# Patient Record
Sex: Female | Born: 1975 | Race: White | Hispanic: No | Marital: Married | Smoking: Former smoker
Health system: Southern US, Community
[De-identification: ages and names within clinical notes are randomized; demographics above are authoritative.]

## PROBLEM LIST (undated history)

## (undated) DIAGNOSIS — G459 Transient cerebral ischemic attack, unspecified: Secondary | ICD-10-CM

## (undated) DIAGNOSIS — J309 Allergic rhinitis, unspecified: Secondary | ICD-10-CM

## (undated) DIAGNOSIS — E781 Pure hyperglyceridemia: Secondary | ICD-10-CM

## (undated) DIAGNOSIS — E7800 Pure hypercholesterolemia, unspecified: Secondary | ICD-10-CM

## (undated) DIAGNOSIS — E119 Type 2 diabetes mellitus without complications: Secondary | ICD-10-CM

## (undated) DIAGNOSIS — J45909 Unspecified asthma, uncomplicated: Secondary | ICD-10-CM

## (undated) HISTORY — DX: Unspecified asthma, uncomplicated: J45.909

## (undated) HISTORY — DX: Transient cerebral ischemic attack, unspecified: G45.9

## (undated) HISTORY — PX: CHOLECYSTECTOMY: SHX55

## (undated) HISTORY — DX: Type 2 diabetes mellitus without complications: E11.9

## (undated) HISTORY — PX: TUBAL LIGATION: SHX77

## (undated) HISTORY — DX: Allergic rhinitis, unspecified: J30.9

## (undated) HISTORY — DX: Pure hyperglyceridemia: E78.1

## (undated) HISTORY — PX: ABDOMINAL HYSTERECTOMY: SHX81

## (undated) HISTORY — DX: Pure hypercholesterolemia, unspecified: E78.00

---

## 2016-01-03 DIAGNOSIS — E119 Type 2 diabetes mellitus without complications: Secondary | ICD-10-CM | POA: Insufficient documentation

## 2016-01-03 HISTORY — DX: Type 2 diabetes mellitus without complications: E11.9

## 2016-04-30 ENCOUNTER — Encounter: Payer: Self-pay | Admitting: Allergy and Immunology

## 2016-04-30 ENCOUNTER — Ambulatory Visit (INDEPENDENT_AMBULATORY_CARE_PROVIDER_SITE_OTHER): Payer: PRIVATE HEALTH INSURANCE | Admitting: Allergy and Immunology

## 2016-04-30 VITALS — BP 122/90 | HR 76 | Temp 98.2°F | Resp 20 | Ht 62.5 in | Wt 179.0 lb

## 2016-04-30 DIAGNOSIS — T7840XA Allergy, unspecified, initial encounter: Secondary | ICD-10-CM | POA: Diagnosis not present

## 2016-04-30 DIAGNOSIS — H1010 Acute atopic conjunctivitis, unspecified eye: Secondary | ICD-10-CM | POA: Diagnosis not present

## 2016-04-30 DIAGNOSIS — J309 Allergic rhinitis, unspecified: Secondary | ICD-10-CM | POA: Diagnosis not present

## 2016-04-30 DIAGNOSIS — J4530 Mild persistent asthma, uncomplicated: Secondary | ICD-10-CM

## 2016-04-30 NOTE — Progress Notes (Signed)
Follow-up Note  Referring Provider: No ref. provider found Primary Provider: Konrad Felix, MD Date of Office Visit: 04/30/2016  Subjective:   Anna Combs (DOB: 08/05/1976) is a 40 y.o. female who [facility] in re-evaluation of the following:  HPI: Anna Combs returns to this clinic in reevaluation of asthma and allergic rhinoconjunctivitis and LPR and tobacco use and a recent allergic reaction. I've not seen her in his clinic since July 2014  Concerning her airway, since she discontinued tobacco use about 2-1/2 years ago she's really done very well and no longer requires a controller agent for her asthma. She only uses a short acting bronchodilator approximately 4 times a year usually for 1 week per episode. She has only required a systemic steroid once in the past 12 months for an episode of "bronchitis and sinusitis". She can exercise without any difficulty.  She's had very little problems with her nose at this point. She does not use any type of nasal steroid. She has no anosmia. She does have a history of migraine presently under very good control.  Her reflux is under very good control while using ranitidine 150 mg twice a day and if she is careful about eating "red sauce" foods. She will take Gaviscon about twice a week if she eats the wrong foods.  Two weeks ago she had an allergic reaction. She ate at [employer] from 8 to 9 PM where she had a small amount of blue cheese dressing on her salad and steak. She woke up at 1:30 in the morning with the sensation of a pulsation in her right head quickly followed by projectile vomiting on 2 occasions and diffuse urticaria and tongue swelling and shortness of breath for which she was evaluated in the emergency room within 12 minutes of allergic reaction onset and treated with epinephrine. She was better within 5 minutes and all of her reaction was gone within 30 minutes. She was apparently also  given systemic steroids.   There was no obvious provoking factor giving rise to this allergic reaction. She has eaten hamburger since that point without any problem. She did not take any over-the-counter medications prior to that reaction.    Medication List      BENADRYL ALLERGY 25 mg capsule Generic drug:  diphenhydrAMINE Take 25 mg by mouth every 6 (six) hours as needed.   CRESTOR 10 MG tablet Generic drug:  rosuvastatin Take 10 mg by mouth daily.   EPINEPHrine 0.3 mg/0.3 mL Soaj injection Commonly known as:  EPI-PEN inject contents OF ONE pen AS NEEDED   ibuprofen 800 MG tablet Commonly known as:  ADVIL,MOTRIN Take 800 mg by mouth every 8 (eight) hours as needed. for pain   metFORMIN 500 MG tablet Commonly known as:  GLUCOPHAGE TAKE TWO TABLETS BY MOUTH AT BREAKFAST AND SUPPER   PROAIR HFA 108 (90 Base) MCG/ACT inhaler Generic drug:  albuterol Inhale 2 puffs into the lungs every 6 (six) hours as needed for wheezing or shortness of breath.   ranitidine 150 MG tablet Commonly known as:  ZANTAC Take 150 mg by mouth daily.   Vitamin D (Ergocalciferol) 50000 units Caps capsule Commonly known as:  DRISDOL Take 50,000 Units by mouth once a week.       Past Medical History:  Diagnosis Date  . Allergic rhinitis   . Asthma   . Diabetes (HCC)   . High cholesterol     Past Surgical History:  Procedure Laterality Date  . ABDOMINAL HYSTERECTOMY    . CHOLECYSTECTOMY    . TUBAL LIGATION      Allergies  Allergen Reactions  . Bactrim [Sulfamethoxazole-Trimethoprim] Shortness Of Breath  . Penicillins Hives  . Sulfa Antibiotics Shortness Of Breath  . Aspirin Hives    Review of systems negative except as noted in HPI / PMHx or noted below:  Review of Systems  Constitutional: Negative.   HENT: Negative.   Eyes: Negative.   Respiratory: Negative.   Cardiovascular: Negative.   Gastrointestinal: Negative.   Genitourinary: Negative.   Musculoskeletal: Negative.     Skin: Negative.   Neurological: Negative.   Endo/Heme/Allergies: Negative.   Psychiatric/Behavioral: Negative.      Objective:   Vitals:   04/30/16 0858  BP: 122/90  Pulse: 76  Resp: 20  Temp: 98.2 F (36.8 C)   Height: 5' 2.5" (158.8 cm)  Weight: 179 lb (81.2 kg)   Physical Exam  Constitutional: She is well-developed, well-nourished, and in no distress.  HENT:  Head: Normocephalic.  Right Ear: Tympanic membrane, external ear and ear canal normal.  Left Ear: Tympanic membrane, external ear and ear canal normal.  Nose: Nose normal. No mucosal edema or rhinorrhea.  Mouth/Throat: Uvula is midline, oropharynx is clear and moist and mucous membranes are normal. No oropharyngeal exudate.  Eyes: Conjunctivae are normal.  Neck: Trachea normal. No tracheal tenderness present. No tracheal deviation present. No thyromegaly present.  Cardiovascular: Normal rate, regular rhythm, S1 normal, S2 normal and normal heart sounds.   No murmur heard. Pulmonary/Chest: Breath sounds normal. No stridor. No respiratory distress. She has no wheezes. She has no rales.  Musculoskeletal: She exhibits no edema.  Lymphadenopathy:       Head (right side): No tonsillar adenopathy present.       Head (left side): No tonsillar adenopathy present.    She has no cervical adenopathy.  Neurological: She is alert. Gait normal.  Skin: No rash noted. She is not diaphoretic. No erythema. Nails show no clubbing.  Psychiatric: Mood and affect normal.    Diagnostics: Allergy skin testing was performed. She did not demonstrate any hypersensitivity against a screening panel of foods or against Penicillium (blue cheese mold)   Spirometry was performed and demonstrated an FEV1 of 2.0 at 71 % of predicted.  The patient had an Asthma Control Test with the following results: ACT Total Score: 23.    Assessment and Plan:   1. Allergic reaction, initial encounter   2. Mild persistent asthma, uncomplicated   3.  Allergic rhinoconjunctivitis     1. Allergen avoidance measures  2. EpiPen, Benadryl, M.D./ER for allergic reaction  3. Continue ProAir HFA or Xopenex nebulization if needed  4. Continue OTC antihistamine if needed  5. Blood - alpha gal panel, CBC with differential, CMP  6. Further evaluation?- Yes, if recurrent  7. Obtain full flu vaccine  8. Contact clinic if recurrent reactions  Raynelle FanningJulie appears to be doing relatively well from a respiratory standpoint but certainly had some type of allergic reaction that developed with unknown etiologic factor. Her history is consistent with alpha gal syndrome and we will evaluate her for this issue as well as looking at the function of her major organs with the blood tests mentioned above. I'll contact her with the results of these blood tests once they're available for review and further evaluation and treatment will be based upon her response to the approach mentioned above and the results of her diagnostic testing.  Allena Katz, MD [place]

## 2016-04-30 NOTE — Patient Instructions (Addendum)
  1. Allergen avoidance measures  2. EpiPen, Benadryl, M.D./ER for allergic reaction  3. Continue ProAir HFA or Xopenex nebulization if needed  4. Continue OTC antihistamine if needed  5. Blood - alpha gal panel, CBC with differential, CMP  6. Further evaluation?- Yes, if recurrent  7. Obtain full flu vaccine  8. Contact clinic if recurrent reactions

## 2016-05-08 ENCOUNTER — Telehealth: Payer: Self-pay

## 2016-05-08 MED ORDER — EPINEPHRINE 0.3 MG/0.3ML IJ SOAJ: INTRAMUSCULAR | 0 refills | Status: DC

## 2016-05-08 NOTE — Telephone Encounter (Signed)
Pt. Advised of  Positive Alpha-Gal lab results as reviewed by Dr. Delorse LekPadgett. Advised to avoid all mammal meat and keep EpiPen at hand.

## 2016-05-08 NOTE — Telephone Encounter (Signed)
Pt advised of all lab results.

## 2016-05-08 NOTE — Telephone Encounter (Signed)
No answer. Called to inform pt that labs showed increased glucose and liver function. Advise to moniter and discuss with PCP.

## 2016-05-08 NOTE — Telephone Encounter (Signed)
error 

## 2016-05-10 ENCOUNTER — Encounter: Payer: Self-pay | Admitting: *Deleted

## 2019-02-04 ENCOUNTER — Telehealth: Payer: Self-pay | Admitting: *Deleted

## 2019-02-04 NOTE — Telephone Encounter (Signed)
REFERRAL SENT TO SCHEDULING AND NOTES ON FILE FROM [facility] [place] [phone].

## 2019-02-06 ENCOUNTER — Telehealth: Payer: Self-pay

## 2019-02-06 NOTE — Telephone Encounter (Signed)
RN called patient and received phone consent for telemedicine visit. Patient does not have a  home BP cuff available, she will weigh herself and all medication out for reconciliation/refills. Patient scheduled for virtual visit and had no further questions.

## 2019-02-09 ENCOUNTER — Other Ambulatory Visit: Payer: Self-pay

## 2019-02-09 ENCOUNTER — Telehealth: Payer: Self-pay | Admitting: Cardiology

## 2019-02-09 ENCOUNTER — Encounter: Payer: Self-pay | Admitting: Cardiology

## 2019-02-09 ENCOUNTER — Telehealth (INDEPENDENT_AMBULATORY_CARE_PROVIDER_SITE_OTHER): Payer: Self-pay | Admitting: Cardiology

## 2019-02-09 VITALS — Ht 62.5 in | Wt 169.0 lb

## 2019-02-09 DIAGNOSIS — E7849 Other hyperlipidemia: Secondary | ICD-10-CM

## 2019-02-09 DIAGNOSIS — R011 Cardiac murmur, unspecified: Secondary | ICD-10-CM

## 2019-02-09 DIAGNOSIS — Z9189 Other specified personal risk factors, not elsewhere classified: Secondary | ICD-10-CM

## 2019-02-09 DIAGNOSIS — E119 Type 2 diabetes mellitus without complications: Secondary | ICD-10-CM

## 2019-02-09 HISTORY — DX: Other hyperlipidemia: E78.49

## 2019-02-09 HISTORY — DX: Other specified personal risk factors, not elsewhere classified: Z91.89

## 2019-02-09 MED ORDER — ICOSAPENT ETHYL 1 G PO CAPS: 2.0000 g | ORAL_CAPSULE | Freq: Two times a day (BID) | ORAL | 4 refills | Status: DC

## 2019-02-09 NOTE — Progress Notes (Signed)
Virtual Visit via Video Note   This visit type was conducted due to national recommendations for restrictions regarding the COVID-19 Pandemic (e.g. social distancing) in an effort to limit this patient's exposure and mitigate transmission in our community.  Due to her co-morbid illnesses, this patient is at least at moderate risk for complications without adequate follow up.  This format is felt to be most appropriate for this patient at this time.  All issues noted in this document were discussed and addressed.  A limited physical exam was performed with this format.  Please refer to the patient's chart for her consent to telehealth for [facility].   Date:  02/09/2019   ID:  Anna AmenJulie Rabine, DOB 1976-06-10, MRN [id number]  Patient Location: Home Provider Location: Home  PCP:  Marlyn CorporalYates, Kate H, [place]  Cardiologist:  No primary care provider on file.  Electrophysiologist:  None   Evaluation Performed:  Consultation - Anna AmenJulie Icenhour was referred by Dr. Tomasa BlaseSchultz for the evaluation of hypertriglyceridemia.  Chief Complaint: Elevated triglycerides  History of Present Illness:    Anna Combs is a 43 y.o. female with past medical history of diabetes mellitus type 2 and familial hyperlipidemia.  She mentions to me that her triglycerides have been greater than 1800 in the past.  She denies any chest pain orthopnea or PND.  She takes care of activities of daily living.  Matter-of-fact she mentions to me that she walks 2 miles a day without any problems.  Her significant issue has been that of hypertriglyceridemia and she tells me that this runs in her family.  At the time of my evaluation, the patient is alert awake oriented and in no distress.  The patient does not have symptoms concerning for COVID-19 infection (fever, chills, cough, or new shortness of breath).    Past Medical History:  Diagnosis Date  . Allergic rhinitis   . Asthma   . Diabetes (HCC)   . High cholesterol    Past Surgical  History:  Procedure Laterality Date  . ABDOMINAL HYSTERECTOMY    . CHOLECYSTECTOMY    . TUBAL LIGATION       Current Meds  Medication Sig  . albuterol (PROAIR HFA) 108 (90 Base) MCG/ACT inhaler Inhale 2 puffs into the lungs every 6 (six) hours as needed for wheezing or shortness of breath.  . busPIRone (BUSPAR) 5 MG tablet Take 5 mg by mouth daily.  Marland Kitchen. co-enzyme Q-10 30 MG capsule Take 30 mg by mouth 3 (three) times daily.  . diphenhydrAMINE (BENADRYL ALLERGY) 25 mg capsule Take 25 mg by mouth every 6 (six) hours as needed.  Marland Kitchen. EPINEPHrine 0.3 mg/0.3 mL IJ SOAJ injection inject contents OF ONE pen AS NEEDED  . ibuprofen (ADVIL,MOTRIN) 800 MG tablet Take 800 mg by mouth every 8 (eight) hours as needed. for pain  . magnesium 30 MG tablet Take 30 mg by mouth 2 (two) times daily.  . metFORMIN (GLUCOPHAGE) 500 MG tablet Take 500 mg by mouth daily with breakfast.   . rosuvastatin (CRESTOR) 10 MG tablet Take 10 mg by mouth 3 (three) times a week.   . TRINTELLIX 20 MG TABS tablet Take 20 mg by mouth daily.  . TRULICITY 1.5 MG/0.5ML SOPN INJECT 1 SYRINGEFUL WEEKLY  . Vitamin D, Ergocalciferol, (DRISDOL) 50000 units CAPS capsule Take 50,000 Units by mouth once a week.     Allergies:   Bactrim [sulfamethoxazole-trimethoprim]; Penicillins; Sulfa antibiotics; and Aspirin   Social History   Tobacco Use  . Smoking  status: Former Smoker    Last attempt to quit: 08/08/2013    Years since quitting: 5.5  . Smokeless tobacco: Never Used  Substance Use Topics  . Alcohol use: Not on file  . Drug use: Not on file     Family Hx: The patient's family history includes Allergic rhinitis in her paternal uncle; Diabetes in her maternal grandmother; Hypertension in her father.  ROS:   Please see the history of present illness.    As mentioned above All other systems reviewed and are negative.   Prior CV studies:   The following studies were reviewed today:  I reviewed records and blood work findings  from primary care physician's office at extensive length  Labs/Other Tests and Data Reviewed:    EKG:  No ECG reviewed.  Recent Labs: No results found for requested labs within last 8760 hours.   Recent Lipid Panel No results found for: CHOL, TRIG, HDL, CHOLHDL, LDLCALC, LDLDIRECT  Wt Readings from Last 3 Encounters:  02/09/19 169 lb (76.7 kg)  04/30/16 179 lb (81.2 kg)     Objective:    Vital Signs:  Ht 5' 2.5" (1.588 m)   Wt 169 lb (76.7 kg)   BMI 30.42 kg/m    VITAL SIGNS:  reviewed  ASSESSMENT & PLAN:    1. Familial hyperlipidemia: I discussed my findings with the patient at extensive length and diet was emphasized.  She is doing good with her exercise plan.  Risks of being overweight emphasized and she is going to do her best to lose weight.  She is taking atorvastatin.  I would like to get a direct LDL on her to understand what her LDL is like.  Also she needs to take Vascepa and we will try to get in touch with her insurance company about letting her have this medication.  She tells me that her plan does not cover it but we will make an effort to see if we can get it released for her. 2. Diabetes mellitus: This is managed by her primary care physician.  Diet was discussed. 3. Echocardiogram will be done to assess history of murmur.  Also when she comes for an echocardiogram I would like her to have an baseline EKG. 4. To risk stratify her from a cardio vascular disease standpoint for a coronary artery disease standpoint I would like to get a calcium scoring and she is willing to get it done at [facility] 5. Patient will be seen in follow-up appointment in 6 months or earlier if the patient has any concerns   COVID-19 Education: The signs and symptoms of COVID-19 were discussed with the patient and how to seek care for testing (follow up with PCP or arrange E-visit).  The importance of social distancing was discussed today.  Time:   Today, I have spent 32 minutes  with the patient with telehealth technology discussing the above problems.  Total time for this evaluation was 45 minutes and it included review of records.  Medication Adjustments/Labs and Tests Ordered: Current medicines are reviewed at length with the patient today.  Concerns regarding medicines are outlined above.   Tests Ordered: No orders of the defined types were placed in this encounter.   Medication Changes: No orders of the defined types were placed in this encounter.   Disposition:  Follow up in 2 month(s)  Signed, Jenean Lindau, MD  02/09/2019 3:00 PM    [place]

## 2019-02-09 NOTE — Patient Instructions (Addendum)
Medication Instruct Your physician has recommended you make the following change in your medication:  START taking vascepa 2 mg( 2 tablets) twice daily  If you need a refill on your cardiac medications before your next appointment, please call your pharmacy.   Lab work: Your physician recommends that you return FASTING for lab work in for Direct LDL.  If you have labs (blood work) drawn today and your tests are completely normal, you will receive your results only by: Marland Kitchen. MyChart Message (if you have MyChart) OR . A paper copy in the mail If you have any lab test that is abnormal or we need to change your treatment, we will call you to review the results.  Testing/Procedures: [facility] have been [facility] to [facility] to have a CT cardiac score performed. You will be contacted to schedule this appt.  Your physician has requested that you have an echocardiogram.YOU HAVE BEEN SCHEDULED TO HAVE THIS PROCEDURE ON March 12, 2019 at 10:15 am. Echocardiography is a painless test that uses sound waves to create images of your heart. It provides your doctor with information about the size and shape of your heart and how well your heart's chambers and valves are working. This procedure takes approximately one hour. There are no restrictions for this procedure.    Follow-Up: At [facility], you and your health needs are our priority.  As part of our continuing mission to provide you with exceptional heart care, we have created designated Provider Care Teams.  These Care Teams include your primary Cardiologist (physician) and Advanced Practice Providers (APPs -  Physician Assistants and Nurse Practitioners) who all work together to provide you with the care you need, when you need it. You will need a follow up appointment in 6 months.   Any Other Special Instructions Will Be Listed Below  Icosapent ethyl capsules What is this medicine? ICOSAPENT ETHYL (eye KOE sa pent eth il) contains essential fats.  It is used to treat high triglyceride levels. This medicine may be used for other purposes; ask your health care provider or pharmacist if you have questions. COMMON BRAND NAME(S): VASCEPA What should I tell my health care provider before I take this medicine? They need to know if you have any of these conditions: -bleeding disorders -liver disease -an unusual or allergic reaction to icosapent ethyl, fish, shellfish, other medicines, foods, dyes, or preservatives -pregnant or trying to get pregnant -breast-feeding How should I use this medicine? Take this medicine by mouth with a glass of water. Follow the directions on the prescription label. Take this medicine with food. Do not cut, crush or chew this medicine. Take your medicine at regular intervals. Do not take it more often than directed. Do not stop taking except on your doctor's advice. Talk to your pediatrician regarding the use of this medicine in children. Special care may be needed. Overdosage: If you think you have taken too much of this medicine contact a poison control center or emergency room at once. NOTE: This medicine is only for you. Do not share this medicine with others. What if I miss a dose? If you miss a dose, take it as soon as you can. If it is almost time for your next dose, take only that dose. Do not take double or extra doses. What may interact with this medicine? This medicine may interact with the following medications: -aspirin and aspirin-like medicines -beta-blockers like metoprolol and propranolol -certain medicines that treat or prevent blood clots like warfarin, enoxaparin,  dalteparin, apixaban, dabigatran, and rivaroxaban -diuretics -female hormones, like estrogens and birth control pills This list may not describe all possible interactions. Give your health care provider a list of all the medicines, herbs, non-prescription drugs, or dietary supplements you use. Also tell them if you smoke, drink  alcohol, or use illegal drugs. Some items may interact with your medicine. What should I watch for while using this medicine? You may need blood work done while you are taking this medicine. Follow a good diet and exercise plan. Taking this medicine does not replace a healthy lifestyle. Some foods that have omega-3 fatty acids naturally are fatty fish like albacore tuna, halibut, herring, mackerel, lake trout, salmon, and sardines. If you are scheduled for any medical or dental procedure, tell your healthcare provider that you are taking this medicine. You may need to stop taking this medicine before the procedure. What side effects may I notice from receiving this medicine? Side effects that you should report to your doctor or health care professional as soon as possible: -allergic reactions like skin rash, itching or hives, swelling of the face, lips, or tongue -breathing problems -unusual bleeding or bruising Side effects that usually do not require medical attention (report to your doctor or health care professional if they continue or are bothersome): -joint pain -sore throat This list may not describe all possible side effects. Call your doctor for medical advice about side effects. You may report side effects to FDA at 1-800-FDA-1088. Where should I keep my medicine? Keep out of the reach of children. Store at room temperature between 15 and 30 degrees C (59 and 86 degrees F). Throw away any unused medicine after the expiration date. NOTE: This sheet is a summary. It may not cover all possible information. If you have questions about this medicine, talk to your doctor, pharmacist, or health care provider.  2019 Elsevier/Gold Standard (2015-09-22 13:40:36)   Coronary Calcium Scan A coronary calcium scan is an imaging test used to look for deposits of calcium and other fatty materials (plaques) in the inner lining of the blood vessels of the heart (coronary arteries). These deposits of  calcium and plaques can partly clog and narrow the coronary arteries without producing any symptoms or warning signs. This puts a person at risk for a heart attack. This test can detect these deposits before symptoms develop. Tell a health care provider about:  Any allergies you have.  All medicines you are taking, including vitamins, herbs, eye drops, creams, and over-the-counter medicines.  Any problems you or family members have had with anesthetic medicines.  Any blood disorders you have.  Any surgeries you have had.  Any medical conditions you have.  Whether you are pregnant or may be pregnant. What are the risks? Generally, this is a safe procedure. However, problems may occur, including:  Harm to a pregnant woman and her unborn baby. This test involves the use of radiation. Radiation exposure can be dangerous to a pregnant woman and her unborn baby. If you are pregnant, you generally should not have this procedure done.  Slight increase in the risk of cancer. This is because of the radiation involved in the test. What happens before the procedure? No preparation is needed for this procedure. What happens during the procedure?   You will undress and remove any jewelry around your neck or chest.  You will put on a hospital gown.  Sticky electrodes will be placed on your chest. The electrodes will be connected to an electrocardiogram (  ECG) machine to record a tracing of the electrical activity of your heart.  A CT scanner will take pictures of your heart. During this time, you will be asked to lie still and hold your breath for 2-3 seconds while a picture of your heart is being taken. The procedure may vary among health care providers and hospitals. What happens after the procedure?  You can get dressed.  You can return to your normal activities.  It is up to you to get the results of your test. Ask your health care provider, or the department that is doing the test, when  your results will be ready. Summary  A coronary calcium scan is an imaging test used to look for deposits of calcium and other fatty materials (plaques) in the inner lining of the blood vessels of the heart (coronary arteries).  Generally, this is a safe procedure. Tell your health care provider if you are pregnant or may be pregnant.  No preparation is needed for this procedure.  A CT scanner will take pictures of your heart.  You can return to your normal activities after the scan is done. This information is not intended to replace advice given to you by your health care provider. Make sure you discuss any questions you have with your health care provider. Document Released: 02/16/2008 Document Revised: 07/09/2016 Document Reviewed: 07/09/2016 Elsevier Interactive Patient Education  2019 [employer]    Echocardiogram An echocardiogram is a procedure that uses painless sound waves (ultrasound) to produce an image of the heart. Images from an echocardiogram can provide important information about:  Signs of coronary artery disease (CAD).  Aneurysm detection. An aneurysm is a weak or damaged part of an artery wall that bulges out from the normal force of blood pumping through the body.  Heart size and shape. Changes in the size or shape of the heart can be associated with certain conditions, including heart failure, aneurysm, and CAD.  Heart muscle function.  Heart valve function.  Signs of a past heart attack.  Fluid buildup around the heart.  Thickening of the heart muscle.  A tumor or infectious growth around the heart valves. Tell a health care provider about:  Any allergies you have.  All medicines you are taking, including vitamins, herbs, eye drops, creams, and over-the-counter medicines.  Any blood disorders you have.  Any surgeries you have had.  Any medical conditions you have.  Whether you are pregnant or may be pregnant. What are the risks?  Generally, this is a safe procedure. However, problems may occur, including:  Allergic reaction to dye (contrast) that may be used during the procedure. What happens before the procedure? No specific preparation is needed. You may eat and drink normally. What happens during the procedure?   An IV tube may be inserted into one of your veins.  You may receive contrast through this tube. A contrast is an injection that improves the quality of the pictures from your heart.  A gel will be applied to your chest.  A wand-like tool (transducer) will be moved over your chest. The gel will help to transmit the sound waves from the transducer.  The sound waves will harmlessly bounce off of your heart to allow the heart images to be captured in real-time motion. The images will be recorded on a computer. The procedure may vary among health care providers and hospitals. What happens after the procedure?  You may return to your normal, everyday life, including diet, activities, and  medicines, unless your health care provider tells you not to do that. Summary  An echocardiogram is a procedure that uses painless sound waves (ultrasound) to produce an image of the heart.  Images from an echocardiogram can provide important information about the size and shape of your heart, heart muscle function, heart valve function, and fluid buildup around your heart.  You do not need to do anything to prepare before this procedure. You may eat and drink normally.  After the echocardiogram is completed, you may return to your normal, everyday life, unless your health care provider tells you not to do that. This information is not intended to replace advice given to you by your health care provider. Make sure you discuss any questions you have with your health care provider. Document Released: 08/17/2000 Document Revised: 09/22/2016 Document Reviewed: 09/22/2016 Elsevier Interactive Patient Education  2019  Elsevier Inc.+

## 2019-02-09 NOTE — Addendum Note (Signed)
Addended by: Beckey Rutter on: 02/09/2019 04:55 PM   Modules accepted: Orders

## 2019-02-09 NOTE — Telephone Encounter (Signed)
Virtual Visit Pre-Appointment Phone Call  "(Name), I am calling you today to discuss your upcoming appointment. We are currently trying to limit exposure to the virus that causes COVID-19 by seeing patients at home rather than in the office."  1. "What is the BEST phone number to call the day of the visit?" - include this in appointment notes  2. Do you have or have access to (through a family member/friend) a smartphone with video capability that we can use for your visit?" a. If yes - list this number in appt notes as cell (if different from BEST phone #) and list the appointment type as a VIDEO visit in appointment notes b. If no - list the appointment type as a PHONE visit in appointment notes  3. Confirm consent - "In the setting of the current Covid19 crisis, you are scheduled for a (phone or video) visit with your provider on (date) at (time).  Just as we do with many in-office visits, in order for you to participate in this visit, we must obtain consent.  If you'd like, I can send this to your mychart (if signed up) or email for you to review.  Otherwise, I can obtain your verbal consent now.  All virtual visits are billed to your insurance company just like a normal visit would be.  By agreeing to a virtual visit, we'd like you to understand that the technology does not allow for your provider to perform an examination, and thus may limit your provider's ability to fully assess your condition. If your provider identifies any concerns that need to be evaluated in person, we will make arrangements to do so.  Finally, though the technology is pretty good, we cannot assure that it will always work on either your or our end, and in the setting of a video visit, we may have to convert it to a phone-only visit.  In either situation, we cannot ensure that we have a secure connection.  Are you willing to proceed?" STAFF: Did the patient verbally acknowledge consent to telehealth visit? Document  YES/NO here:YES per Cameron Ali, RN   4. Advise patient to be prepared - "Two hours prior to your appointment, go ahead and check your blood pressure, pulse, oxygen saturation, and your weight (if you have the equipment to check those) and write them all down. When your visit starts, your provider will ask you for this information. If you have an Apple Watch or Kardia device, please plan to have heart rate information ready on the day of your appointment. Please have a pen and paper handy nearby the day of the visit as well."  5. Give patient instructions for MyChart download to smartphone OR Doximity/Doxy.me as below if video visit (depending on what platform provider is using)  6. Inform patient they will receive a phone call 15 minutes prior to their appointment time (may be from unknown caller ID) so they should be prepared to answer    TELEPHONE CALL NOTE  Anna Combs has been deemed a candidate for a follow-up tele-health visit to limit community exposure during the Covid-19 pandemic. I spoke with the patient via phone to ensure availability of phone/video source, confirm preferred email & phone number, and discuss instructions and expectations.  I reminded Anna Combs to be prepared with any vital sign and/or heart rhythm information that could potentially be obtained via home monitoring, at the time of her visit. I reminded Anna Combs to expect a phone call prior  to her visit.  Minette BrineLisa B Welch 02/09/2019 2:06 PM   INSTRUCTIONS FOR DOWNLOADING THE MYCHART APP TO SMARTPHONE  - The patient must first make sure to have activated MyChart and know their login information - If Apple, go to [employer] Store and type in MyChart in the search bar and download the app. If Android, ask patient to go to [employer] Play Store and type in [place] in the search bar and download the app. The app is free but as with any other app downloads, their phone may require them to verify saved payment information or  Apple/Android password.  - The patient will need to then log into the app with their MyChart username and password, and select [facility] as their healthcare provider to link the account. When it is time for your visit, go to the MyChart app, find appointments, and click Begin Video Visit. Be sure to Select Allow for your device to access the Microphone and Camera for your visit. You will then be connected, and your provider will be with you shortly.  **If they have any issues connecting, or need assistance please contact MyChart service desk [phone])**  **If using a computer, in order to ensure the best quality for their visit they will need to use either of the following Internet Browsers: [employer] Edge, or Google Chrome**  IF USING DOXIMITY or DOXY.ME - The patient will receive a link just prior to their visit by text.     FULL LENGTH CONSENT FOR TELE-HEALTH VISIT   I hereby voluntarily request, consent and authorize [facility] and its employed or contracted physicians, physician assistants, nurse practitioners or other licensed health care professionals (the Practitioner), to provide me with telemedicine health care services (the Services") as deemed necessary by the treating Practitioner. I acknowledge and consent to receive the Services by the Practitioner via telemedicine. I understand that the telemedicine visit will involve communicating with the Practitioner through live audiovisual communication technology and the disclosure of certain medical information by electronic transmission. I acknowledge that I have been given the opportunity to request an in-person assessment or other available alternative prior to the telemedicine visit and am voluntarily participating in the telemedicine visit.  I understand that I have the right to withhold or withdraw my consent to the use of telemedicine in the course of my care at any time, without affecting my right to future care  or treatment, and that the Practitioner or I may terminate the telemedicine visit at any time. I understand that I have the right to inspect all information obtained and/or recorded in the course of the telemedicine visit and may receive copies of available information for a reasonable fee.  I understand that some of the potential risks of receiving the Services via telemedicine include:   Delay or interruption in medical evaluation due to technological equipment failure or disruption;  Information transmitted may not be sufficient (e.g. poor resolution of images) to allow for appropriate medical decision making by the Practitioner; and/or   In rare instances, security protocols could fail, causing a breach of personal health information.  Furthermore, I acknowledge that it is my responsibility to provide information about my medical history, conditions and care that is complete and accurate to the best of my ability. I acknowledge that Practitioner's advice, recommendations, and/or decision may be based on factors not within their control, such as incomplete or inaccurate data provided by me or distortions of diagnostic images or specimens that may result from electronic transmissions.  I understand that the practice of medicine is not an exact science and that Practitioner makes no warranties or guarantees regarding treatment outcomes. I acknowledge that I will receive a copy of this consent concurrently upon execution via email to the email address I last provided but may also request a printed copy by calling the office of [facility].    I understand that my insurance will be billed for this visit.   I have read or had this consent read to me.  I understand the contents of this consent, which adequately explains the benefits and risks of the Services being provided via telemedicine.   I have been provided ample opportunity to ask questions regarding this consent and the Services and have had  my questions answered to my satisfaction.  I give my informed consent for the services to be provided through the use of telemedicine in my medical care  By participating in this telemedicine visit I agree to the above.

## 2019-02-11 ENCOUNTER — Other Ambulatory Visit: Payer: Self-pay

## 2019-02-11 ENCOUNTER — Telehealth: Payer: Self-pay

## 2019-02-12 ENCOUNTER — Telehealth: Payer: Self-pay | Admitting: Cardiology

## 2019-02-12 NOTE — Telephone Encounter (Signed)
Patient needs lab order to go to [facility] due to her insurance plese, it will be free to her., she will pick up samples tomorrow afternoon between 4-4:30. We cna fax lab orders to hosp.

## 2019-02-13 NOTE — Telephone Encounter (Signed)
Patient requested lab request be sent to [facility], RN faxed request.

## 2019-02-17 NOTE — Telephone Encounter (Signed)
Additional labs ordered.

## 2019-03-12 ENCOUNTER — Other Ambulatory Visit: Payer: Self-pay

## 2019-03-18 ENCOUNTER — Telehealth: Payer: Self-pay | Admitting: Cardiology

## 2019-03-18 NOTE — Telephone Encounter (Signed)
Patient has been calling for results of Cardiac Scoring at [facility] for 3 weeks and still not gotten results.. Please call paitent asap!

## 2019-03-20 ENCOUNTER — Telehealth: Payer: Self-pay | Admitting: Cardiology

## 2019-03-20 NOTE — Telephone Encounter (Signed)
Pt cancelled all upcoming appts, states she called a few days ago for some test results and no one has returned her call-she is going to go somewhere in [place]

## 2019-03-20 NOTE — Telephone Encounter (Signed)
Left vm for patient to return call concerning calcium score results from [place].

## 2019-08-13 ENCOUNTER — Telehealth: Payer: Self-pay | Admitting: Cardiology

## 2020-05-06 ENCOUNTER — Other Ambulatory Visit: Payer: Self-pay | Admitting: Cardiology

## 2020-05-06 DIAGNOSIS — Z9189 Other specified personal risk factors, not elsewhere classified: Secondary | ICD-10-CM

## 2020-05-06 DIAGNOSIS — E7849 Other hyperlipidemia: Secondary | ICD-10-CM

## 2020-06-26 DIAGNOSIS — G459 Transient cerebral ischemic attack, unspecified: Secondary | ICD-10-CM

## 2020-07-25 ENCOUNTER — Other Ambulatory Visit: Payer: Self-pay

## 2020-07-25 DIAGNOSIS — J309 Allergic rhinitis, unspecified: Secondary | ICD-10-CM | POA: Insufficient documentation

## 2020-07-25 DIAGNOSIS — J45909 Unspecified asthma, uncomplicated: Secondary | ICD-10-CM | POA: Insufficient documentation

## 2020-07-25 DIAGNOSIS — E119 Type 2 diabetes mellitus without complications: Secondary | ICD-10-CM | POA: Insufficient documentation

## 2020-07-25 DIAGNOSIS — E7800 Pure hypercholesterolemia, unspecified: Secondary | ICD-10-CM | POA: Insufficient documentation

## 2020-07-26 ENCOUNTER — Encounter: Payer: Self-pay | Admitting: Cardiology

## 2020-07-26 ENCOUNTER — Other Ambulatory Visit: Payer: Self-pay

## 2020-07-26 ENCOUNTER — Ambulatory Visit (INDEPENDENT_AMBULATORY_CARE_PROVIDER_SITE_OTHER): Payer: 59 | Admitting: Cardiology

## 2020-07-26 VITALS — BP 130/78 | HR 87 | Ht 62.0 in | Wt 158.2 lb

## 2020-07-26 DIAGNOSIS — E119 Type 2 diabetes mellitus without complications: Secondary | ICD-10-CM

## 2020-07-26 DIAGNOSIS — E7849 Other hyperlipidemia: Secondary | ICD-10-CM

## 2020-07-26 DIAGNOSIS — Z9189 Other specified personal risk factors, not elsewhere classified: Secondary | ICD-10-CM | POA: Diagnosis not present

## 2020-07-26 DIAGNOSIS — E088 Diabetes mellitus due to underlying condition with unspecified complications: Secondary | ICD-10-CM

## 2020-07-26 HISTORY — DX: Diabetes mellitus due to underlying condition with unspecified complications: E08.8

## 2020-07-26 MED ORDER — ICOSAPENT ETHYL 1 G PO CAPS: 2.0000 g | ORAL_CAPSULE | Freq: Two times a day (BID) | ORAL | 12 refills | Status: DC

## 2020-07-26 NOTE — Progress Notes (Signed)
Cardiology Office Note:    Date:  07/26/2020   ID:  Anna Combs, DOB 1976/08/16, MRN [id number]  PCP:  Anna Corporal, PA  Cardiologist:  Anna Brothers, MD   Referring MD: Anna Corporal, PA    ASSESSMENT:    1. Familial hyperlipidemia   2. Type 2 diabetes mellitus without complication, without long-term current use of insulin (HCC)   3. At high risk for coronary artery disease   4. Diabetes mellitus due to underlying condition with unspecified complications (HCC)    PLAN:    In order of problems listed above:  1. Primary prevention stressed with the patient.  Importance of compliance with diet medication stressed and she vocalized understanding.  I told her to walk at least half an hour a day on a daily basis and she promises to do so. 2. Diabetes mellitus: Hemoglobin A1c is elevated.  This is followed by her primary care physician and endocrinologist.  I told her about optimization of treatment and diet and compliance and she promises to do so. 3. Hypertriglyceridemia/familial hyper lipidemia: I discussed my findings with her at length.  Her triglycerides are in excess of 1500.  I initiated Vascepa 2 g twice daily.  She will be back in 6 weeks for liver lipid check.  I will give her an appointment with the lipid clinic for the needful.  Patient had multiple questions which were answered to her satisfaction. 4. Patient will be seen in follow-up appointment in 6 months or earlier if the patient has any concerns    Medication Adjustments/Labs and Tests Ordered: Current medicines are reviewed at length with the patient today.  Concerns regarding medicines are outlined above.  No orders of the defined types were placed in this encounter.  No orders of the defined types were placed in this encounter.    No chief complaint on file.    History of Present Illness:    Anna Combs is a 44 y.o. female.  Patient has past medical history of diabetes mellitus, familial  hyperlipidemia with marked triglyceridemia in excess of 1500.  Patient mentions to me that she was in the hospital recently for TIA-like symptoms.  I reviewed records extensively.  Patient denies any problems at this time.  She takes care of activities of daily living.  No chest pain orthopnea or PND.  At the time of my evaluation, the patient is alert awake oriented and in no distress.  Past Medical History:  Diagnosis Date  . Allergic rhinitis   . Asthma   . At high risk for coronary artery disease 02/09/2019  . Diabetes (HCC)   . Familial hyperlipidemia 02/09/2019  . High cholesterol   . Type 2 diabetes mellitus without complication (HCC) 01/03/2016    Past Surgical History:  Procedure Laterality Date  . ABDOMINAL HYSTERECTOMY    . CHOLECYSTECTOMY    . TUBAL LIGATION      Current Medications: Current Meds  Medication Sig  . albuterol (PROAIR HFA) 108 (90 Base) MCG/ACT inhaler Inhale 2 puffs into the lungs every 6 (six) hours as needed for wheezing or shortness of breath.  . clopidogrel (PLAVIX) 75 MG tablet Take 75 mg by mouth daily.  . diphenhydrAMINE (BENADRYL ALLERGY) 25 mg capsule Take 25 mg by mouth every 6 (six) hours as needed.  . DULoxetine (CYMBALTA) 60 MG capsule Take 60 mg by mouth daily.  Marland Kitchen EPINEPHrine 0.3 mg/0.3 mL IJ SOAJ injection inject contents OF ONE pen AS NEEDED  . famotidine (  PEPCID) 40 MG tablet Take 40 mg by mouth 2 (two) times daily.  . fenofibrate (TRICOR) 145 MG tablet Take 145 mg by mouth daily.  Marland Kitchen gabapentin (NEURONTIN) 100 MG capsule Take 100 mg by mouth 3 (three) times daily.  Marland Kitchen ibuprofen (ADVIL,MOTRIN) 800 MG tablet Take 800 mg by mouth every 8 (eight) hours as needed. for pain  . magnesium 30 MG tablet Take 30 mg by mouth 2 (two) times daily.  . metFORMIN (GLUCOPHAGE) 500 MG tablet Take 500 mg by mouth daily with breakfast.   . methocarbamol (ROBAXIN) 500 MG tablet Take 500-1,000 mg by mouth 3 (three) times daily as needed.  . montelukast (SINGULAIR) 10  MG tablet Take 10 mg by mouth daily.  . ondansetron (ZOFRAN-ODT) 8 MG disintegrating tablet Take 8 mg by mouth every 8 (eight) hours as needed.  . rosuvastatin (CRESTOR) 40 MG tablet Take 40 mg by mouth daily.  . TRULICITY 3 MG/0.5ML SOPN Inject 3 mg into the skin once a week.  . vitamin C (ASCORBIC ACID) 250 MG tablet Take 250 mg by mouth 2 (two) times daily.  . Vitamin D, Ergocalciferol, (DRISDOL) 50000 units CAPS capsule Take 50,000 Units by mouth 2 (two) times a week.   . zinc gluconate 50 MG tablet Take 50 mg by mouth daily.     Allergies:   Bactrim [sulfamethoxazole-trimethoprim], Penicillins, Sulfa antibiotics, and Aspirin   Social History   Socioeconomic History  . Marital status: Married    Spouse name: Not on file  . Number of children: Not on file  . Years of education: Not on file  . Highest education level: Not on file  Occupational History  . Not on file  Tobacco Use  . Smoking status: Former Smoker    Quit date: 08/08/2013    Years since quitting: 6.9  . Smokeless tobacco: Never Used  Substance and Sexual Activity  . Alcohol use: Not on file  . Drug use: Not on file  . Sexual activity: Not on file  Other Topics Concern  . Not on file  Social History Narrative  . Not on file   Social Determinants of Health   Financial Resource Strain:   . Difficulty of Paying Living Expenses: Not on file  Food Insecurity:   . Worried About Programme researcher, broadcasting/film/video in the Last Year: Not on file  . Ran Out of Food in the Last Year: Not on file  Transportation Needs:   . Lack of Transportation (Medical): Not on file  . Lack of Transportation (Non-Medical): Not on file  Physical Activity:   . Days of Exercise per Week: Not on file  . Minutes of Exercise per Session: Not on file  Stress:   . Feeling of Stress : Not on file  Social Connections:   . Frequency of Communication with Friends and Family: Not on file  . Frequency of Social Gatherings with Friends and Family: Not on  file  . Attends Religious Services: Not on file  . Active Member of Clubs or Organizations: Not on file  . Attends Banker Meetings: Not on file  . Marital Status: Not on file     Family History: The patient's family history includes Allergic rhinitis in her paternal uncle; Diabetes in her maternal grandmother; Hypertension in her father.  ROS:   Please see the history of present illness.    All other systems reviewed and are negative.  EKGs/Labs/Other Studies Reviewed:    The following studies were reviewed  today: EKG reveals sinus rhythm and nonspecific ST-T changes   Recent Labs: No results found for requested labs within last 8760 hours.  Recent Lipid Panel No results found for: CHOL, TRIG, HDL, CHOLHDL, VLDL, LDLCALC, LDLDIRECT  Physical Exam:    VS:  BP 130/78   Pulse 87   Ht 5\' 2"  (1.575 m)   Wt 158 lb 3.2 oz (71.8 kg)   SpO2 97%   BMI 28.94 kg/m     Wt Readings from Last 3 Encounters:  07/26/20 158 lb 3.2 oz (71.8 kg)  02/09/19 169 lb (76.7 kg)  04/30/16 179 lb (81.2 kg)     GEN: Patient is in no acute distress HEENT: Normal NECK: No JVD; No carotid bruits LYMPHATICS: No lymphadenopathy CARDIAC: Hear sounds regular, 2/6 systolic murmur at the apex. RESPIRATORY:  Clear to auscultation without rales, wheezing or rhonchi  ABDOMEN: Soft, non-tender, non-distended MUSCULOSKELETAL:  No edema; No deformity  SKIN: Warm and dry NEUROLOGIC:  Alert and oriented x 3 PSYCHIATRIC:  Normal affect   Signed, 05/02/16, MD  07/26/2020 8:37 AM    [facility]

## 2020-07-26 NOTE — Patient Instructions (Signed)
Medication Instructions:  Your physician has recommended you make the following change in your medication:   Start Vascepa 2 gms twice daily.  *If you need a refill on your cardiac medications before your next appointment, please call your pharmacy*   Lab Work: None ordered If you have labs (blood work) drawn today and your tests are completely normal, you will receive your results only by: Marland Kitchen MyChart Message (if you have MyChart) OR . A paper copy in the mail If you have any lab test that is abnormal or we need to change your treatment, we will call you to review the results.   Testing/Procedures: None ordered   Follow-Up: At [facility], you and your health needs are our priority.  As part of our continuing mission to provide you with exceptional heart care, we have created designated Provider Care Teams.  These Care Teams include your primary Cardiologist (physician) and Advanced Practice Providers (APPs -  Physician Assistants and Nurse Practitioners) who all work together to provide you with the care you need, when you need it.  We recommend signing up for the patient portal called "MyChart".  Sign up information is provided on this After Visit Summary.  MyChart is used to connect with patients for Virtual Visits (Telemedicine).  Patients are able to view lab/test results, encounter notes, upcoming appointments, etc.  Non-urgent messages can be sent to your provider as well.   To learn more about what you can do with MyChart, go to [email or web address].    Your next appointment:   4 month(s)  The format for your next appointment:   In Person  Provider:   Belva Crome, MD   Other Instructions NA

## 2020-09-22 ENCOUNTER — Encounter: Payer: Self-pay | Admitting: *Deleted

## 2020-09-23 ENCOUNTER — Other Ambulatory Visit: Payer: Self-pay

## 2020-09-23 ENCOUNTER — Ambulatory Visit (INDEPENDENT_AMBULATORY_CARE_PROVIDER_SITE_OTHER): Payer: 59 | Admitting: Diagnostic Neuroimaging

## 2020-09-23 ENCOUNTER — Encounter: Payer: Self-pay | Admitting: Diagnostic Neuroimaging

## 2020-09-23 VITALS — BP 118/82 | HR 88 | Ht 63.5 in | Wt 158.0 lb

## 2020-09-23 DIAGNOSIS — G450 Vertebro-basilar artery syndrome: Secondary | ICD-10-CM

## 2020-09-23 NOTE — Progress Notes (Signed)
[facility]  PATIENT: Anna Combs DOB: 07-Aug-1976  REFERRING CLINICIAN: Marlyn Corporal, PA HISTORY FROM: patient  REASON FOR VISIT: new consult    HISTORICAL  CHIEF COMPLAINT:  Chief Complaint  Patient presents with  . New Patient (Initial Visit)    Rm 7, alone. Here to follow up on hospitalization s/p TIA. Doing well. No new symptoms.    HISTORY OF PRESENT ILLNESS:   45 year old female with uncontrolled diabetes and hyperlipidemia, here for evaluation of TIA.  June 26, 2020 patient had 10-minute episode of complete blindness, spinning sensation, nausea.  As symptoms improved patient had some double vision which then resolved.  Patient went to the hospital for evaluation and had TIA work-up completed.  No further recurrent events.  No prior similar events.   REVIEW OF SYSTEMS: Full 14 system review of systems performed and negative with exception of: As per HPI.  ALLERGIES: Allergies  Allergen Reactions  . Bactrim [Sulfamethoxazole-Trimethoprim] Shortness Of Breath  . Penicillins Hives  . Sulfa Antibiotics Shortness Of Breath  . Aspirin Hives  . Codeine   . Shellfish Allergy     HOME MEDICATIONS: Outpatient Medications Prior to Visit  Medication Sig Dispense Refill  . albuterol (VENTOLIN HFA) 108 (90 Base) MCG/ACT inhaler Inhale 2 puffs into the lungs every 6 (six) hours as needed for wheezing or shortness of breath.    . Cholecalciferol (VITAMIN D3 PO) Take by mouth.    . clopidogrel (PLAVIX) 75 MG tablet Take 75 mg by mouth daily.    . diphenhydrAMINE (BENADRYL) 25 mg capsule Take 25 mg by mouth every 6 (six) hours as needed.    . DULoxetine (CYMBALTA) 60 MG capsule Take 60 mg by mouth daily.    Marland Kitchen EPINEPHrine 0.3 mg/0.3 mL IJ SOAJ injection inject contents OF ONE pen AS NEEDED 1 Device 0  . famotidine (PEPCID) 40 MG tablet Take 40 mg by mouth 2 (two) times daily.    . fenofibrate (TRICOR) 145 MG tablet Take 145 mg by mouth daily.    Marland Kitchen  gabapentin (NEURONTIN) 100 MG capsule Take 100 mg by mouth 3 (three) times daily.    Marland Kitchen ibuprofen (ADVIL,MOTRIN) 800 MG tablet Take 800 mg by mouth every 8 (eight) hours as needed. for pain  0  . magnesium 30 MG tablet Take 30 mg by mouth 2 (two) times daily.    . metFORMIN (GLUCOPHAGE) 500 MG tablet Take 1,000 mg by mouth daily with breakfast.  2  . methocarbamol (ROBAXIN) 500 MG tablet Take 500-1,000 mg by mouth 3 (three) times daily as needed.    . montelukast (SINGULAIR) 10 MG tablet Take 10 mg by mouth daily.    . ondansetron (ZOFRAN) 4 MG tablet Take 4 mg by mouth every 8 (eight) hours as needed for nausea or vomiting.    . rosuvastatin (CRESTOR) 40 MG tablet Take 40 mg by mouth daily.    . TRULICITY 3 MG/0.5ML SOPN Inject 1.5 mg into the skin once a week.    . vitamin C (ASCORBIC ACID) 250 MG tablet Take 250 mg by mouth 2 (two) times daily.    . Vitamin D, Ergocalciferol, (DRISDOL) 50000 units CAPS capsule Take 50,000 Units by mouth 2 (two) times a week.   1  . zinc gluconate 50 MG tablet Take 50 mg by mouth daily.    Marland Kitchen icosapent Ethyl (VASCEPA) 1 g capsule Take 2 capsules (2 g total) by mouth 2 (two) times daily. 120 capsule 12  . ondansetron (  ZOFRAN-ODT) 8 MG disintegrating tablet Take 8 mg by mouth every 8 (eight) hours as needed.     No facility-administered medications prior to visit.    PAST MEDICAL HISTORY: Past Medical History:  Diagnosis Date  . Allergic rhinitis   . Asthma   . At high risk for coronary artery disease 02/09/2019  . Diabetes (HCC)   . Familial hyperlipidemia 02/09/2019  . High cholesterol   . TIA (transient ischemic attack)   . Type 2 diabetes mellitus without complication (HCC) 01/03/2016    PAST SURGICAL HISTORY: Past Surgical History:  Procedure Laterality Date  . ABDOMINAL HYSTERECTOMY    . CHOLECYSTECTOMY    . TUBAL LIGATION      FAMILY HISTORY: Family History  Problem Relation Age of Onset  . Hypertension Father   . Allergic rhinitis Paternal  Uncle   . Diabetes Maternal Grandmother     SOCIAL HISTORY: Social History   Socioeconomic History  . Marital status: Married    Spouse name: Not on file  . Number of children: Not on file  . Years of education: Not on file  . Highest education level: Not on file  Occupational History  . Not on file  Tobacco Use  . Smoking status: Former Smoker    Quit date: 08/08/2013    Years since quitting: 7.1  . Smokeless tobacco: Never Used  Substance and Sexual Activity  . Alcohol use: Not on file  . Drug use: Not on file  . Sexual activity: Not on file  Other Topics Concern  . Not on file  Social History Narrative  . Not on file   Social Determinants of Health   Financial Resource Strain: Not on file  Food Insecurity: Not on file  Transportation Needs: Not on file  Physical Activity: Not on file  Stress: Not on file  Social Connections: Not on file  Intimate Partner Violence: Not on file     PHYSICAL EXAM  GENERAL EXAM/CONSTITUTIONAL: Vitals:  Vitals:   09/23/20 0848  BP: 118/82  Pulse: 88  Weight: 158 lb (71.7 kg)  Height: 5' 3.5" (1.613 m)     Body mass index is 27.55 kg/m. Wt Readings from Last 3 Encounters:  09/23/20 158 lb (71.7 kg)  07/26/20 158 lb 3.2 oz (71.8 kg)  02/09/19 169 lb (76.7 kg)     Patient is in no distress; well developed, nourished and groomed; neck is supple  CARDIOVASCULAR:  Examination of carotid arteries is normal; no carotid bruits  Regular rate and rhythm, no murmurs  Examination of peripheral vascular system by observation and palpation is normal  EYES:  Ophthalmoscopic exam of optic discs and posterior segments is normal; no papilledema or hemorrhages  No exam data present  MUSCULOSKELETAL:  Gait, strength, tone, movements noted in Neurologic exam below  NEUROLOGIC: MENTAL STATUS:  No flowsheet data found.  awake, alert, oriented to person, place and time  recent and remote memory intact  normal attention  and concentration  language fluent, comprehension intact, naming intact  fund of knowledge appropriate  CRANIAL NERVE:   2nd - no papilledema on fundoscopic exam  2nd, 3rd, 4th, 6th - pupils equal and reactive to light, visual fields full to confrontation, extraocular muscles intact, no nystagmus  5th - facial sensation symmetric  7th - facial strength symmetric  8th - hearing intact  9th - palate elevates symmetrically, uvula midline  11th - shoulder shrug symmetric  12th - tongue protrusion midline  MOTOR:   normal bulk  and tone, full strength in the BUE, BLE  SENSORY:   normal and symmetric to light touch, temperature, vibration  COORDINATION:   finger-nose-finger, fine finger movements normal  REFLEXES:   deep tendon reflexes 1+ and symmetric  GAIT/STATION:   narrow based gait     DIAGNOSTIC DATA (LABS, IMAGING, TESTING) - I reviewed patient records, labs, notes, testing and imaging myself where available.  No results found for: WBC, HGB, HCT, MCV, PLT No results found for: NA, K, CL, CO2, GLUCOSE, BUN, CREATININE, CALCIUM, PROT, ALBUMIN, AST, ALT, ALKPHOS, BILITOT, GFRNONAA, GFRAA No results found for: CHOL, HDL, LDLCALC, LDLDIRECT, TRIG, CHOLHDL No results found for: VPXT0G No results found for: VITAMINB12 No results found for: [facility]  October 2021 [facility] hospital workup -CT head/CTA neck --> no emergent finding or explanation for dizziness; minimal atheromatous changes -MRI brain--> no acute findings -2D echocardiogram--> EF 60 to 65%; negative bubble study   ASSESSMENT AND PLAN  45 y.o. 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  Risk factors include diabetes and hyperlipidemia.   Dx:  1. Vertebrobasilar TIAs      PLAN:  CRYPTOGENIC TIA (vertebrobasilar) - continue plavix 75mg  daily long term - continue diabetes and lipid control - continue B12 replacement -  check TEE; if negative then place implanted loop recorder  Orders Placed This Encounter  Procedures  . Ambulatory referral to Cardiology   Return in about 6 months (around 03/23/2021).    03/25/2021, MD 09/23/2020, 8:57 AM Certified in Neurology, Neurophysiology and Neuroimaging  [facility] [address] [place], [place] [phone]

## 2020-09-23 NOTE — Patient Instructions (Addendum)
-   continue plavix 75mg  daily long term  - continue diabetes and lipid control  - continue B12 replacement  - check TEE; if negative then place implanted loop recorder

## 2020-10-17 ENCOUNTER — Encounter: Payer: Self-pay | Admitting: Internal Medicine

## 2020-10-17 ENCOUNTER — Ambulatory Visit (INDEPENDENT_AMBULATORY_CARE_PROVIDER_SITE_OTHER): Payer: Self-pay | Admitting: Internal Medicine

## 2020-10-17 ENCOUNTER — Other Ambulatory Visit: Payer: Self-pay

## 2020-10-17 VITALS — BP 114/74 | HR 85 | Ht 63.5 in | Wt 161.0 lb

## 2020-10-17 DIAGNOSIS — G459 Transient cerebral ischemic attack, unspecified: Secondary | ICD-10-CM

## 2020-10-17 NOTE — H&P (View-Only) (Signed)
 Electrophysiology Office Note   Date:  10/17/2020   ID:  Inola Kniss, DOB 11/25/1975, MRN [id number]  PCP:  Yates, Kate H, PA  Cardiologist:  Dr Revankar Primary Electrophysiologist: James Allred, MD    CC: stroke   History of Present Illness: Anna Combs is a 44 y.o. female who presents today for electrophysiology evaluation.   She is referred by Dr Penumalli for further evaluation of possibel cardiac causes for stroke. She had TIA 06/26/20 with complete blindness, vertigo, and nausea.  She had subsequent diplopia which has resolved. She has been started on plavix.  There is concern for possible cardioembolic source as the cause. She has rare palpitations, typically lasting a minute or two.  She attributes this to anxiety.  Today, she denies symptoms of  chest pain, shortness of breath, orthopnea, PND, lower extremity edema, claudication, dizziness, presyncope, syncope, bleeding, or neurologic sequela. The patient is tolerating medications without difficulties and is otherwise without complaint today.    Past Medical History:  Diagnosis Date  . Allergic rhinitis   . Asthma   . At high risk for coronary artery disease 02/09/2019  . Familial hyperlipidemia 02/09/2019  . High cholesterol   . Hypertriglyceridemia   . TIA (transient ischemic attack)   . Type 2 diabetes mellitus without complication (HCC) 01/03/2016   Past Surgical History:  Procedure Laterality Date  . ABDOMINAL HYSTERECTOMY    . CHOLECYSTECTOMY    . TUBAL LIGATION       Current Outpatient Medications  Medication Sig Dispense Refill  . albuterol (VENTOLIN HFA) 108 (90 Base) MCG/ACT inhaler Inhale 2 puffs into the lungs every 6 (six) hours as needed for wheezing or shortness of breath.    . Cholecalciferol (VITAMIN D3 PO) Take by mouth.    . clopidogrel (PLAVIX) 75 MG tablet Take 75 mg by mouth daily.    . diphenhydrAMINE (BENADRYL) 25 mg capsule Take 25 mg by mouth every 6 (six) hours as needed.    .  Dulaglutide (TRULICITY) 1.5 MG/0.5ML SOPN Inject 1.5 mg into the skin once a week.    . DULoxetine (CYMBALTA) 60 MG capsule Take 60 mg by mouth daily.    . EPINEPHrine 0.3 mg/0.3 mL IJ SOAJ injection inject contents OF ONE pen AS NEEDED 1 Device 0  . famotidine (PEPCID) 40 MG tablet Take 40 mg by mouth 2 (two) times daily.    . fenofibrate (TRICOR) 145 MG tablet Take 145 mg by mouth daily.    . gabapentin (NEURONTIN) 100 MG capsule Take 100 mg by mouth 3 (three) times daily.    . ibuprofen (ADVIL,MOTRIN) 800 MG tablet Take 800 mg by mouth every 8 (eight) hours as needed. for pain  0  . magnesium 30 MG tablet Take 30 mg by mouth 2 (two) times daily.    . metFORMIN (GLUCOPHAGE) 500 MG tablet Take 1,000 mg by mouth daily with breakfast.  2  . methocarbamol (ROBAXIN) 500 MG tablet Take 500-1,000 mg by mouth 3 (three) times daily as needed.    . montelukast (SINGULAIR) 10 MG tablet Take 10 mg by mouth daily.    . ondansetron (ZOFRAN) 4 MG tablet Take 4 mg by mouth every 8 (eight) hours as needed for nausea or vomiting.    . rosuvastatin (CRESTOR) 40 MG tablet Take 40 mg by mouth daily.    . vitamin C (ASCORBIC ACID) 250 MG tablet Take 250 mg by mouth 2 (two) times daily.    . Vitamin D, Ergocalciferol, (DRISDOL) 50000   units CAPS capsule Take 50,000 Units by mouth 2 (two) times a week.   1  . zinc gluconate 50 MG tablet Take 50 mg by mouth daily.     No current facility-administered medications for this visit.    Allergies:   Bactrim [sulfamethoxazole-trimethoprim], Penicillins, Sulfa antibiotics, Aspirin, Codeine, and Shellfish allergy   Social History:  The patient  reports that she quit smoking about 7 years ago. She has never used smokeless tobacco. She reports current alcohol use.   Family History:  The patient's family history includes Allergic rhinitis in her paternal uncle; Diabetes in her maternal grandmother; Hypertension in her father.    ROS:  Please see the history of present  illness.   All other systems are personally reviewed and negative.    PHYSICAL EXAM: VS:  BP 114/74   Pulse 85   Ht 5' 3.5" (1.613 m)   Wt 161 lb (73 kg)   SpO2 97%   BMI 28.07 kg/m  , BMI Body mass index is 28.07 kg/m. GEN: Well nourished, well developed, in no acute distress HEENT: normal Neck: no JVD, carotid bruits, or masses Cardiac: RRR; no murmurs, rubs, or gallops,no edema  Respiratory:  clear to auscultation bilat erally, normal work of breathing GI: soft, nontender, nondistended, + BS MS: no deformity or atrophy Skin: warm and dry  Neuro:  Strength and sensation are intact Psych: euthymic mood, full affect  EKG:  EKG is ordered today. The ekg ordered today is personally reviewed and shows sinus rhythm   Recent Labs: No results found for requested labs within last 8760 hours.  personally reviewed   Lipid Panel  No results found for: CHOL, TRIG, HDL, CHOLHDL, VLDL, LDLCALC, LDLDIRECT personally reviewed   Wt Readings from Last 3 Encounters:  10/17/20 161 lb (73 kg)  09/23/20 158 lb (71.7 kg)  07/26/20 158 lb 3.2 oz (71.8 kg)      Other studies personally reviewed: Additional studies/ records that were reviewed today include:  Echo, prior ekgs, prior office visits Review of the above records today demonstrates: echo 06/26/20 is normal  All ekgs available in epic are reviewed and normal  Assessment and Plan:  1. Cryptogenic TIA The patient recently had TIA of unknown source.  I agree with Dr Marjory Lies that we should exclude cardioembolic source.  We discussed TEE and ILR implantation at length today. Risks, benefits, and alteratives to both TEE and implantable loop recorder were discussed with the patient today.   At this time, the patient is very clear in their decision to proceed.  We will schedule at the next available time if insurance allows  Hillis Range MD, [facility] 10/17/2020 10:51 AM

## 2020-10-17 NOTE — Progress Notes (Signed)
Electrophysiology Office Note   Date:  10/17/2020   ID:  Phoebe Marter, DOB 18-Dec-1975, MRN [id number]  PCP:  Marlyn Corporal, PA  Cardiologist:  Dr Tomie China Primary Electrophysiologist: Hillis Range, MD    CC: stroke   History of Present Illness: Anna Combs is a 45 y.o. female who presents today for electrophysiology evaluation.   She is referred by Dr Marjory Lies for further evaluation of possibel cardiac causes for stroke. She had TIA 06/26/20 with complete blindness, vertigo, and nausea.  She had subsequent diplopia which has resolved. She has been started on plavix.  There is concern for possible cardioembolic source as the cause. She has rare palpitations, typically lasting a minute or two.  She attributes this to anxiety.  Today, she denies symptoms of  chest pain, shortness of breath, orthopnea, PND, lower extremity edema, claudication, dizziness, presyncope, syncope, bleeding, or neurologic sequela. The patient is tolerating medications without difficulties and is otherwise without complaint today.    Past Medical History:  Diagnosis Date  . Allergic rhinitis   . Asthma   . At high risk for coronary artery disease 02/09/2019  . Familial hyperlipidemia 02/09/2019  . High cholesterol   . Hypertriglyceridemia   . TIA (transient ischemic attack)   . Type 2 diabetes mellitus without complication (HCC) 01/03/2016   Past Surgical History:  Procedure Laterality Date  . ABDOMINAL HYSTERECTOMY    . CHOLECYSTECTOMY    . TUBAL LIGATION       Current Outpatient Medications  Medication Sig Dispense Refill  . albuterol (VENTOLIN HFA) 108 (90 Base) MCG/ACT inhaler Inhale 2 puffs into the lungs every 6 (six) hours as needed for wheezing or shortness of breath.    . Cholecalciferol (VITAMIN D3 PO) Take by mouth.    . clopidogrel (PLAVIX) 75 MG tablet Take 75 mg by mouth daily.    . diphenhydrAMINE (BENADRYL) 25 mg capsule Take 25 mg by mouth every 6 (six) hours as needed.    .  Dulaglutide (TRULICITY) 1.5 MG/0.5ML SOPN Inject 1.5 mg into the skin once a week.    . DULoxetine (CYMBALTA) 60 MG capsule Take 60 mg by mouth daily.    Marland Kitchen EPINEPHrine 0.3 mg/0.3 mL IJ SOAJ injection inject contents OF ONE pen AS NEEDED 1 Device 0  . famotidine (PEPCID) 40 MG tablet Take 40 mg by mouth 2 (two) times daily.    . fenofibrate (TRICOR) 145 MG tablet Take 145 mg by mouth daily.    Marland Kitchen gabapentin (NEURONTIN) 100 MG capsule Take 100 mg by mouth 3 (three) times daily.    Marland Kitchen ibuprofen (ADVIL,MOTRIN) 800 MG tablet Take 800 mg by mouth every 8 (eight) hours as needed. for pain  0  . magnesium 30 MG tablet Take 30 mg by mouth 2 (two) times daily.    . metFORMIN (GLUCOPHAGE) 500 MG tablet Take 1,000 mg by mouth daily with breakfast.  2  . methocarbamol (ROBAXIN) 500 MG tablet Take 500-1,000 mg by mouth 3 (three) times daily as needed.    . montelukast (SINGULAIR) 10 MG tablet Take 10 mg by mouth daily.    . ondansetron (ZOFRAN) 4 MG tablet Take 4 mg by mouth every 8 (eight) hours as needed for nausea or vomiting.    . rosuvastatin (CRESTOR) 40 MG tablet Take 40 mg by mouth daily.    . vitamin C (ASCORBIC ACID) 250 MG tablet Take 250 mg by mouth 2 (two) times daily.    . Vitamin D, Ergocalciferol, (DRISDOL) 50000  units CAPS capsule Take 50,000 Units by mouth 2 (two) times a week.   1  . zinc gluconate 50 MG tablet Take 50 mg by mouth daily.     No current facility-administered medications for this visit.    Allergies:   Bactrim [sulfamethoxazole-trimethoprim], Penicillins, Sulfa antibiotics, Aspirin, Codeine, and Shellfish allergy   Social History:  The patient  reports that she quit smoking about 7 years ago. She has never used smokeless tobacco. She reports current alcohol use.   Family History:  The patient's family history includes Allergic rhinitis in her paternal uncle; Diabetes in her maternal grandmother; Hypertension in her father.    ROS:  Please see the history of present  illness.   All other systems are personally reviewed and negative.    PHYSICAL EXAM: VS:  BP 114/74   Pulse 85   Ht 5' 3.5" (1.613 m)   Wt 161 lb (73 kg)   SpO2 97%   BMI 28.07 kg/m  , BMI Body mass index is 28.07 kg/m. GEN: Well nourished, well developed, in no acute distress HEENT: normal Neck: no JVD, carotid bruits, or masses Cardiac: RRR; no murmurs, rubs, or gallops,no edema  Respiratory:  clear to auscultation bilat erally, normal work of breathing GI: soft, nontender, nondistended, + BS MS: no deformity or atrophy Skin: warm and dry  Neuro:  Strength and sensation are intact Psych: euthymic mood, full affect  EKG:  EKG is ordered today. The ekg ordered today is personally reviewed and shows sinus rhythm   Recent Labs: No results found for requested labs within last 8760 hours.  personally reviewed   Lipid Panel  No results found for: CHOL, TRIG, HDL, CHOLHDL, VLDL, LDLCALC, LDLDIRECT personally reviewed   Wt Readings from Last 3 Encounters:  10/17/20 161 lb (73 kg)  09/23/20 158 lb (71.7 kg)  07/26/20 158 lb 3.2 oz (71.8 kg)      Other studies personally reviewed: Additional studies/ records that were reviewed today include:  Echo, prior ekgs, prior office visits Review of the above records today demonstrates: echo 06/26/20 is normal  All ekgs available in epic are reviewed and normal  Assessment and Plan:  1. Cryptogenic TIA The patient recently had TIA of unknown source.  I agree with Dr Marjory Lies that we should exclude cardioembolic source.  We discussed TEE and ILR implantation at length today. Risks, benefits, and alteratives to both TEE and implantable loop recorder were discussed with the patient today.   At this time, the patient is very clear in their decision to proceed.  We will schedule at the next available time if insurance allows  Hillis Range MD, [facility] 10/17/2020 10:51 AM

## 2020-10-17 NOTE — Patient Instructions (Addendum)
Medication Instructions:  Your physician recommends that you continue on your current medications as directed. Please refer to the Current Medication list given to you today.  Labwork: None ordered.  Testing/Procedures: Your physician has requested that you have a TEE. During a TEE, sound waves are used to create images of your heart. It provides your doctor with information about the size and shape of your heart and how well your heart's chambers and valves are working. In this test, a transducer is attached to the end of a flexible tube that's guided down your throat and into your esophagus (the tube leading from you mouth to your stomach) to get a more detailed image of your heart. You are not awake for the procedure. Please see the instruction sheet given to you today. For further information please visit [email or web address]    Follow-Up: Your physician wants you to follow-up in: as needed with Hillis Range, MD    Any Other Special Instructions Will Be Listed Below (If Applicable).  If you need a refill on your cardiac medications before your next appointment, please call your pharmacy.         COVID TEST--Feb 28 at 8 am -- You will go to [address] [place], [place] [address]  for your Covid testing.   This is a drive thru test site.   Be sure to share with the first checkpoint that you are there for pre-procedure/surgery testing. Stay in your car and the nurse team will come to your car to test you.  After you are tested please go home and self-quarantine until the day of your procedure.      You are scheduled for a TEE on March 1 with Dr. Rennis Golden.  Please arrive at the [facility] (Main Entrance A) at [facility]: [address] [place], [place] [address] at 9:30 am  DIET: Nothing to eat or drink after midnight except a sip of water with medications (see medication instructions below)  Medication Instructions: Hold Metformain  Continue your anticoagulant:  Plavix You will need to continue your anticoagulant after your procedure until you  are told by your  Provider that it is safe to stop   You must have a responsible person to drive you home and stay in the waiting area during your procedure. Failure to do so could result in cancellation.  Bring your insurance cards.  *Special Note: Every effort is made to have your procedure done on time. Occasionally there are emergencies that occur at the hospital that may cause delays. Please be patient if a delay does occur.    Implantable loop recorder implant at 1:30 pm with Dr. Johney Frame in the Cath Lab.   Implantable Loop Recorder Placement  An implantable loop recorder is a small electronic device that is placed under the skin of your chest. The device records the electrical activity of your heart over a long period of time. Your health care provider can download these recordings to monitor your heart. You may need an implantable loop recorder if you have periods of abnormal heart activity (arrhythmias) or unexplained fainting (syncope). The recorder can be left in place for 1 year or longer. Tell a health care provider about:  Any allergies you have.  All medicines you are taking, including vitamins, herbs, eye drops, creams, and over-the-counter medicines.  Any problems you or family members have had with anesthetic medicines.  Any blood disorders you have.  Any surgeries you have had.  Any medical conditions you  have.  Whether you are pregnant or may be pregnant. What are the risks? Generally, this is a safe procedure. However, problems may occur, including:  Infection.  Bleeding.  Allergic reactions to anesthetic medicines.  Damage to nerves or blood vessels.  Failure of the device to work. This could require another surgery to replace it. What happens before the procedure?  You may have a physical exam, blood tests, and imaging tests of your heart, such as a chest  X-ray.  Follow instructions from your health care provider about eating or drinking restrictions.  Ask your health care provider about: ? Changing or stopping your regular medicines. This is especially important if you are taking diabetes medicines or blood thinners. ? Taking medicines such as aspirin and ibuprofen. These medicines can thin your blood. Do not take these medicines unless your health care provider tells you to take them. ? Taking over-the-counter medicines, vitamins, herbs, and supplements.  Ask your health care provider how your surgical site will be marked or identified.  Ask your health care provider what steps will be taken to help prevent infection. These may include: ? Removing hair at the surgery site. ? Washing skin with a germ-killing soap.  Plan to have someone take you home from the hospital or clinic.  Plan to have a responsible adult care for you for at least 24 hours after you leave the hospital or clinic. This is important.  Do not use any products that contain nicotine or tobacco, such as cigarettes and e-cigarettes. If you need help quitting, ask your health care provider.   What happens during the procedure?  An IV will be inserted into one of your veins.  You may be given one or more of the following: ? A medicine to help you relax (sedative). ? A medicine to numb the area (local anesthetic).  A small incision will be made on the left side of your upper chest.  A pocket will be created under your skin.  The device will be placed in the pocket.  The incision will be closed with stitches (sutures) or adhesive strips.  A bandage (dressing) will be placed over the incision. The procedure may vary among health care providers and hospitals. What happens after the procedure?  Your blood pressure, heart rate, breathing rate, and blood oxygen level will be monitored until you leave the hospital or clinic.  You may be able to go home on the day of  your surgery. Before you go home: ? Your health care provider will program your recorder. ? You will learn how to trigger your device with a handheld activator. ? You will learn how to send recordings to your health care provider. ? You will get an ID card for your device, and you will be told when to use it.  Do not drive for 24 hours if you were given a sedative during your procedure. Summary  An implantable loop recorder is a small electronic device that is placed under the skin of your chest to monitor your heart over a long period of time.  The recorder can be left in place for 1 year or longer.  Plan to have someone take you home from the hospital or clinic. This information is not intended to replace advice given to you by your health care provider. Make sure you discuss any questions you have with your health care provider. Document Revised: 05/10/2020 Document Reviewed: 10/05/2017 Elsevier Patient Education  2021 [employer].

## 2020-10-28 ENCOUNTER — Ambulatory Visit: Payer: Self-pay | Admitting: Internal Medicine

## 2020-10-31 ENCOUNTER — Other Ambulatory Visit (HOSPITAL_COMMUNITY)
Admission: RE | Admit: 2020-10-31 | Discharge: 2020-10-31 | Disposition: A | Discharge status: Home / Self Care | Payer: HRSA Program | Source: Ambulatory Visit | Attending: Internal Medicine | Admitting: Internal Medicine

## 2020-10-31 DIAGNOSIS — Z01812 Encounter for preprocedural laboratory examination: Secondary | ICD-10-CM | POA: Diagnosis present

## 2020-10-31 DIAGNOSIS — Z20822 Contact with and (suspected) exposure to covid-19: Secondary | ICD-10-CM | POA: Insufficient documentation

## 2020-11-01 ENCOUNTER — Ambulatory Visit (HOSPITAL_BASED_OUTPATIENT_CLINIC_OR_DEPARTMENT_OTHER)
Admission: RE | Admit: 2020-11-01 | Discharge: 2020-11-01 | Disposition: A | Discharge status: Home / Self Care | Payer: 59 | Source: Ambulatory Visit | Attending: Internal Medicine | Admitting: Internal Medicine

## 2020-11-01 ENCOUNTER — Encounter (HOSPITAL_COMMUNITY)
Admission: RE | Disposition: A | Discharge status: Home / Self Care | Payer: Self-pay | Source: Home / Self Care | Attending: Internal Medicine

## 2020-11-01 ENCOUNTER — Encounter (HOSPITAL_COMMUNITY): Payer: Self-pay | Admitting: Internal Medicine

## 2020-11-01 ENCOUNTER — Ambulatory Visit (HOSPITAL_COMMUNITY): Payer: 59 | Admitting: Certified Registered Nurse Anesthetist

## 2020-11-01 ENCOUNTER — Ambulatory Visit (HOSPITAL_COMMUNITY)
Admission: RE | Admit: 2020-11-01 | Discharge: 2020-11-01 | Disposition: A | Discharge status: Home / Self Care | Payer: 59 | Attending: Internal Medicine | Admitting: Internal Medicine

## 2020-11-01 DIAGNOSIS — Z833 Family history of diabetes mellitus: Secondary | ICD-10-CM | POA: Diagnosis not present

## 2020-11-01 DIAGNOSIS — Z87891 Personal history of nicotine dependence: Secondary | ICD-10-CM | POA: Diagnosis not present

## 2020-11-01 DIAGNOSIS — E119 Type 2 diabetes mellitus without complications: Secondary | ICD-10-CM | POA: Diagnosis not present

## 2020-11-01 DIAGNOSIS — E7800 Pure hypercholesterolemia, unspecified: Secondary | ICD-10-CM | POA: Insufficient documentation

## 2020-11-01 DIAGNOSIS — Z885 Allergy status to narcotic agent status: Secondary | ICD-10-CM | POA: Diagnosis not present

## 2020-11-01 DIAGNOSIS — E781 Pure hyperglyceridemia: Secondary | ICD-10-CM | POA: Insufficient documentation

## 2020-11-01 DIAGNOSIS — Z90710 Acquired absence of both cervix and uterus: Secondary | ICD-10-CM | POA: Insufficient documentation

## 2020-11-01 DIAGNOSIS — Z7984 Long term (current) use of oral hypoglycemic drugs: Secondary | ICD-10-CM | POA: Insufficient documentation

## 2020-11-01 DIAGNOSIS — Z7902 Long term (current) use of antithrombotics/antiplatelets: Secondary | ICD-10-CM | POA: Diagnosis not present

## 2020-11-01 DIAGNOSIS — G459 Transient cerebral ischemic attack, unspecified: Secondary | ICD-10-CM | POA: Diagnosis not present

## 2020-11-01 DIAGNOSIS — I639 Cerebral infarction, unspecified: Secondary | ICD-10-CM | POA: Diagnosis present

## 2020-11-01 DIAGNOSIS — Z880 Allergy status to penicillin: Secondary | ICD-10-CM | POA: Diagnosis not present

## 2020-11-01 DIAGNOSIS — Z8249 Family history of ischemic heart disease and other diseases of the circulatory system: Secondary | ICD-10-CM | POA: Diagnosis not present

## 2020-11-01 DIAGNOSIS — Z882 Allergy status to sulfonamides status: Secondary | ICD-10-CM | POA: Insufficient documentation

## 2020-11-01 DIAGNOSIS — Z9049 Acquired absence of other specified parts of digestive tract: Secondary | ICD-10-CM | POA: Diagnosis not present

## 2020-11-01 DIAGNOSIS — Z886 Allergy status to analgesic agent status: Secondary | ICD-10-CM | POA: Insufficient documentation

## 2020-11-01 DIAGNOSIS — Z881 Allergy status to other antibiotic agents status: Secondary | ICD-10-CM | POA: Insufficient documentation

## 2020-11-01 DIAGNOSIS — Z79899 Other long term (current) drug therapy: Secondary | ICD-10-CM | POA: Insufficient documentation

## 2020-11-01 HISTORY — PX: TEE WITHOUT CARDIOVERSION: SHX5443

## 2020-11-01 HISTORY — PX: LOOP RECORDER INSERTION: EP1214

## 2020-11-01 HISTORY — PX: BUBBLE STUDY: SHX6837

## 2020-11-01 LAB — POCT I-STAT, CHEM 8
BUN: 20 mg/dL (ref 6–20)
Calcium, Ion: 1.2 mmol/L (ref 1.15–1.40)
Chloride: 100 mmol/L (ref 98–111)
Creatinine, Ser: 0.5 mg/dL (ref 0.44–1.00)
Glucose, Bld: 319 mg/dL — ABNORMAL HIGH (ref 70–99)
HCT: 43 % (ref 36.0–46.0)
Hemoglobin: 14.6 g/dL (ref 12.0–15.0)
Potassium: 4.3 mmol/L (ref 3.5–5.1)
Sodium: 135 mmol/L (ref 135–145)
TCO2: 22 mmol/L (ref 22–32)

## 2020-11-01 LAB — SARS CORONAVIRUS 2 (TAT 6-24 HRS): SARS Coronavirus 2: NEGATIVE

## 2020-11-01 LAB — GLUCOSE, CAPILLARY: Glucose-Capillary: 289 mg/dL — ABNORMAL HIGH (ref 70–99)

## 2020-11-01 SURGERY — LOOP RECORDER INSERTION

## 2020-11-01 SURGERY — ECHOCARDIOGRAM, TRANSESOPHAGEAL
Anesthesia: Monitor Anesthesia Care

## 2020-11-01 MED ORDER — SODIUM CHLORIDE 0.9 % IV SOLN: INTRAVENOUS | Status: DC

## 2020-11-01 MED ORDER — BUTAMBEN-TETRACAINE-BENZOCAINE 2-2-14 % EX AERO
INHALATION_SPRAY | CUTANEOUS | Status: DC | PRN
  Administered 2020-11-01: 2 via TOPICAL

## 2020-11-01 MED ORDER — LIDOCAINE 2% (20 MG/ML) 5 ML SYRINGE
INTRAMUSCULAR | Status: DC | PRN
  Administered 2020-11-01: 40 mg via INTRAVENOUS

## 2020-11-01 MED ORDER — LIDOCAINE-EPINEPHRINE 1 %-1:100000 IJ SOLN
INTRAMUSCULAR | Status: AC
  Filled 2020-11-01: qty 1

## 2020-11-01 MED ORDER — PROPOFOL 10 MG/ML IV BOLUS
INTRAVENOUS | Status: DC | PRN
  Administered 2020-11-01: 30 mg via INTRAVENOUS
  Administered 2020-11-01 (×3): 25 mg via INTRAVENOUS
  Administered 2020-11-01: 50 mg via INTRAVENOUS
  Administered 2020-11-01 (×2): 25 mg via INTRAVENOUS

## 2020-11-01 MED ORDER — LIDOCAINE HCL (PF) 1 % IJ SOLN
INTRAMUSCULAR | Status: DC | PRN
  Administered 2020-11-01: 5 mL

## 2020-11-01 MED ORDER — PROPOFOL 500 MG/50ML IV EMUL
INTRAVENOUS | Status: DC | PRN
  Administered 2020-11-01: 100 ug/kg/min via INTRAVENOUS

## 2020-11-01 SURGICAL SUPPLY — 2 items
MONITOR REVEAL LINQ II (Prosthesis & Implant Heart) ×2 IMPLANT
PACK LOOP INSERTION (CUSTOM PROCEDURE TRAY) ×2 IMPLANT

## 2020-11-01 NOTE — Discharge Instructions (Signed)
Wound care instructions (heart monitor implant) Keep incision clean and dry for 3 days. You can remove outer plastic dressing tomorrow. Leave steri-strips (little pieces of tape) on until seen in the office for wound check appointment. Call the office [phone]) for redness, drainage, swelling, or fever.   TEE  YOU HAD AN CARDIAC PROCEDURE TODAY: Refer to the procedure report and other information in the discharge instructions given to you for any specific questions about what was found during the examination. If this information does not answer your questions, please call [facility] office at [phone] to clarify.   DIET: Your first meal following the procedure should be a light meal and then it is ok to progress to your normal diet. A half-sandwich or bowl of soup is an example of a good first meal. Heavy or fried foods are harder to digest and may make you feel nauseous or bloated. Drink plenty of fluids but you should avoid alcoholic beverages for 24 hours. If you had a esophageal dilation, please see attached instructions for diet.   ACTIVITY: Your care partner should take you home directly after the procedure. You should plan to take it easy, moving slowly for the rest of the day. You can resume normal activity the day after the procedure however YOU SHOULD NOT DRIVE, use power tools, machinery or perform tasks that involve climbing or major physical exertion for 24 hours (because of the sedation medicines used during the test).   SYMPTOMS TO REPORT IMMEDIATELY: A cardiologist can be reached at any hour. Please call [phone] for any of the following symptoms:  Vomiting of blood or coffee ground material  New, significant abdominal pain  New, significant chest pain or pain under the shoulder blades  Painful or persistently difficult swallowing  New shortness of breath  Black, tarry-looking or red, bloody stools  FOLLOW UP:  Please also call with any specific questions about  appointments or follow up tests.

## 2020-11-01 NOTE — Progress Notes (Signed)
  Echocardiogram Echocardiogram Transesophageal has been performed.  Anna Combs 11/01/2020, 11:27 AM

## 2020-11-01 NOTE — Anesthesia Postprocedure Evaluation (Signed)
Anesthesia Post Note  Patient: Anna Combs  Procedure(s) Performed: TRANSESOPHAGEAL ECHOCARDIOGRAM (TEE) (N/A ) BUBBLE STUDY     Patient location during evaluation: PACU Anesthesia Type: MAC Level of consciousness: awake and alert and oriented Pain management: pain level controlled Vital Signs Assessment: post-procedure vital signs reviewed and stable Respiratory status: spontaneous breathing, nonlabored ventilation and respiratory function stable Cardiovascular status: blood pressure returned to baseline Postop Assessment: no apparent nausea or vomiting Anesthetic complications: no   No complications documented.  Last Vitals:  Vitals:   11/01/20 1240 11/01/20 1243  BP:  107/66  Pulse: (!) 105 (!) 105  Resp: 17 18  Temp:    SpO2: 96% 97%    Last Pain:  Vitals:   11/01/20 1243  TempSrc:   PainSc: 0-No pain                 Brennan Bailey

## 2020-11-01 NOTE — CV Procedure (Signed)
TRANSESOPHAGEAL ECHOCARDIOGRAM (TEE) NOTE  INDICATIONS: cryptogenic stroke  PROCEDURE:   Informed consent was obtained prior to the procedure. The risks, benefits and alternatives for the procedure were discussed and the patient comprehended these risks.  Risks include, but are not limited to, cough, sore throat, vomiting, nausea, somnolence, esophageal and stomach trauma or perforation, bleeding, low blood pressure, aspiration, pneumonia, infection, trauma to the teeth and death.    After a procedural time-out, the patient was given propofol per anesthesia for sedation.  The patient's heart rate, blood pressure, and oxygen saturation are monitored continuously during the procedure.The oropharynx was anesthetized with topical cetacaine.  The transesophageal probe was inserted in the esophagus and stomach without difficulty and multiple views were obtained.  The patient was kept under observation until the patient left the procedure room.  I was present face-to-face 100% of this time. The patient left the procedure room in stable condition.   Agitated microbubble saline contrast was not administered.  COMPLICATIONS:    There were no immediate complications.  Findings:  1. LEFT VENTRICLE: The left ventricular wall thickness is normal.  The left ventricular cavity is normal in size. Wall motion is normal.  LVEF is 60-65%.  2. RIGHT VENTRICLE:  The right ventricle is normal in structure and function without any thrombus or masses.    3. LEFT ATRIUM:  The left atrium is normal in size without any thrombus or masses.  There is not spontaneous echo contrast ("smoke") in the left atrium consistent with a low flow state.  4. LEFT ATRIAL APPENDAGE:  The small left atrial appendage is free of any thrombus or masses. The appendage has single lobes. Pulse doppler indicates moderate flow in the appendage.  5. ATRIAL SEPTUM:  The atrial septum appears intact and is free of thrombus and/or masses.   There is no evidence for interatrial shunting by color doppler and saline microbubble. A few late bubbles were noted in the left atrium, likely consistent with small intrapulmonary shunt.  6. RIGHT ATRIUM:  The right atrium is normal in size and function without any thrombus or masses.  7. MITRAL VALVE:  The mitral valve is normal in structure and function with trivial regurgitation.  There were no vegetations or stenosis.  8. AORTIC VALVE:  The aortic valve is trileaflet, normal in structure and function with no regurgitation.  There were no vegetations or stenosis  9. TRICUSPID VALVE:  The tricuspid valve is normal in structure and function with trivial regurgitation.  There were no vegetations or stenosis  10.  PULMONIC VALVE:  The pulmonic valve is normal in structure and function with no regurgitation.  There were no vegetations or stenosis.   11. AORTIC ARCH, ASCENDING AND DESCENDING AORTA:  There was grade 1 Myrtis Ser et. Al, 1992) atherosclerosis of the ascending aorta, aortic arch, or proximal descending aorta.  12. PULMONARY VEINS: Anomalous pulmonary venous return was not noted.  13. PERICARDIUM: The pericardium appeared normal and non-thickened.  There is no pericardial effusion.  IMPRESSION:   1. Negative for PFO 2. Small amount of late bubbles in the LA, suggestive of intrapulmonary shunt 3. No LAA thrombus 4. No significant valve disease 5. LVEF 60-65%  RECOMMENDATIONS:    1. No obvious cardiac source of stroke. Recommend proceeding with ILR per Dr. Johney Frame.  Time Spent Directly with the Patient:  45 minutes   Chrystie Nose, MD, [facility], FACP  [place]  [facility]  Medical Director of the [facility] Lipid Disorders &  Cardiovascular Risk Reduction Clinic Diplomate of the American Board of Clinical Lipidology Attending Cardiologist  Direct Dial: [phone]  Fax: [phone]  Website:  [email or web address] 11/01/2020, 11:03 AM

## 2020-11-01 NOTE — Transfer of Care (Signed)
Immediate Anesthesia Transfer of Care Note  Patient: Anna Combs  Procedure(s) Performed: TRANSESOPHAGEAL ECHOCARDIOGRAM (TEE) (N/A ) BUBBLE STUDY  Patient Location: Endoscopy Unit  Anesthesia Type:MAC  Level of Consciousness: awake and drowsy  Airway & Oxygen Therapy: Patient Spontanous Breathing and Patient connected to nasal cannula oxygen  Post-op Assessment: Report given to RN and Post -op Vital signs reviewed and stable  Post vital signs: Reviewed and stable  Last Vitals:  Vitals Value Taken Time  BP 119/69 11/01/20 1103  Temp    Pulse 118 11/01/20 1104  Resp 18 11/01/20 1104  SpO2 97 % 11/01/20 1104  Vitals shown include unvalidated device data.  Last Pain:  Vitals:   11/01/20 0939  TempSrc: Temporal  PainSc: 0-No pain         Complications: No complications documented.

## 2020-11-01 NOTE — Interval H&P Note (Signed)
History and Physical Interval Note:  11/01/2020 10:22 AM  Anna Combs  has presented today for surgery, with the diagnosis of STROKE,TIA.  The various methods of treatment have been discussed with the patient and family. After consideration of risks, benefits and other options for treatment, the patient has consented to  Procedure(s): TRANSESOPHAGEAL ECHOCARDIOGRAM (TEE) (N/A) as a surgical intervention.  The patient's history has been reviewed, patient examined, no change in status, stable for surgery.  I have reviewed the patient's chart and labs.  Questions were answered to the patient's satisfaction.     Anna Combs

## 2020-11-01 NOTE — Interval H&P Note (Signed)
History and Physical Interval Note:  11/01/2020 9:57 AM  Anna Combs  has presented today for surgery, with the diagnosis of stroke.  The various methods of treatment have been discussed with the patient and family. After consideration of risks, benefits and other options for treatment, the patient has consented to  Procedure(s): LOOP RECORDER INSERTION (N/A) as a surgical intervention.  The patient's history has been reviewed, patient examined, no change in status, stable for surgery.  I have reviewed the patient's chart and labs.  Questions were answered to the patient's satisfaction.     Hillis Range

## 2020-11-01 NOTE — Anesthesia Preprocedure Evaluation (Addendum)
Anesthesia Evaluation  Patient identified by MRN, date of birth, ID band Patient awake    Reviewed: Allergy & Precautions, NPO status , Patient's Chart, lab work & pertinent test results  History of Anesthesia Complications Negative for: history of anesthetic complications  Airway Mallampati: II  TM Distance: >3 FB Neck ROM: Full    Dental  (+) Chipped,    Pulmonary asthma , former smoker,    Pulmonary exam normal        Cardiovascular negative cardio ROS Normal cardiovascular exam     Neuro/Psych CVA negative psych ROS   GI/Hepatic negative GI ROS, Neg liver ROS,   Endo/Other  diabetes, Poorly Controlled, Type 2, Oral Hypoglycemic Agents  Renal/GU negative Renal ROS  negative genitourinary   Musculoskeletal negative musculoskeletal ROS (+)   Abdominal   Peds  Hematology negative hematology ROS (+)   Anesthesia Other Findings Day of surgery medications reviewed with patient.  Reproductive/Obstetrics negative OB ROS                            Anesthesia Physical Anesthesia Plan  ASA: IV  Anesthesia Plan: MAC   Post-op Pain Management:    Induction:   PONV Risk Score and Plan: Treatment may vary due to age or medical condition and Propofol infusion  Airway Management Planned: Natural Airway and Nasal Cannula  Additional Equipment: None  Intra-op Plan:   Post-operative Plan:   Informed Consent: I have reviewed the patients History and Physical, chart, labs and discussed the procedure including the risks, benefits and alternatives for the proposed anesthesia with the patient or authorized representative who has indicated his/her understanding and acceptance.       Plan Discussed with: CRNA  Anesthesia Plan Comments:         Anesthesia Quick Evaluation

## 2020-11-01 NOTE — Anesthesia Procedure Notes (Signed)
Procedure Name: MAC Date/Time: 11/01/2020 10:38 AM Performed by: Colin Benton, CRNA Pre-anesthesia Checklist: Patient identified, Emergency Drugs available, Suction available and Patient being monitored Patient Re-evaluated:Patient Re-evaluated prior to induction Oxygen Delivery Method: Nasal cannula Induction Type: IV induction Airway Equipment and Method: Bite block Placement Confirmation: positive ETCO2 Dental Injury: Teeth and Oropharynx as per pre-operative assessment

## 2020-11-02 ENCOUNTER — Encounter (HOSPITAL_COMMUNITY): Payer: Self-pay | Admitting: Internal Medicine

## 2020-11-02 LAB — POCT I-STAT, CHEM 8
BUN: 24 mg/dL — ABNORMAL HIGH (ref 6–20)
Calcium, Ion: 1.17 mmol/L (ref 1.15–1.40)
Chloride: 100 mmol/L (ref 98–111)
Creatinine, Ser: 0.5 mg/dL (ref 0.44–1.00)
Glucose, Bld: 325 mg/dL — ABNORMAL HIGH (ref 70–99)
HCT: 46 % (ref 36.0–46.0)
Hemoglobin: 15.6 g/dL — ABNORMAL HIGH (ref 12.0–15.0)
Potassium: 5.1 mmol/L (ref 3.5–5.1)
Sodium: 133 mmol/L — ABNORMAL LOW (ref 135–145)
TCO2: 23 mmol/L (ref 22–32)

## 2020-11-10 ENCOUNTER — Ambulatory Visit: Payer: 59

## 2020-11-10 ENCOUNTER — Telehealth: Payer: Self-pay

## 2020-11-10 NOTE — Telephone Encounter (Signed)
The pt states she got the app but she has to replace her phone. [employer] is sending her a new phone so she can download the app. I also rescheduled her appointment for 11/17/2020

## 2020-11-10 NOTE — Telephone Encounter (Signed)
I left the message for the pt to return my call. I need to know if she has a monitor or app.

## 2020-11-17 ENCOUNTER — Ambulatory Visit: Payer: 59

## 2020-11-28 DIAGNOSIS — Z780 Asymptomatic menopausal state: Secondary | ICD-10-CM | POA: Insufficient documentation

## 2020-11-28 DIAGNOSIS — G4700 Insomnia, unspecified: Secondary | ICD-10-CM

## 2020-11-28 DIAGNOSIS — G459 Transient cerebral ischemic attack, unspecified: Secondary | ICD-10-CM | POA: Insufficient documentation

## 2020-11-28 DIAGNOSIS — E781 Pure hyperglyceridemia: Secondary | ICD-10-CM | POA: Insufficient documentation

## 2020-11-28 HISTORY — DX: Insomnia, unspecified: G47.00

## 2020-11-28 HISTORY — DX: Asymptomatic menopausal state: Z78.0

## 2020-11-29 ENCOUNTER — Ambulatory Visit: Payer: 59 | Admitting: Cardiology

## 2020-12-14 ENCOUNTER — Ambulatory Visit (INDEPENDENT_AMBULATORY_CARE_PROVIDER_SITE_OTHER): Payer: 59

## 2020-12-14 DIAGNOSIS — G459 Transient cerebral ischemic attack, unspecified: Secondary | ICD-10-CM | POA: Diagnosis not present

## 2020-12-16 LAB — CUP PACEART REMOTE DEVICE CHECK
Date Time Interrogation Session: 20220412152705
Implantable Pulse Generator Implant Date: 20220303

## 2020-12-29 NOTE — Progress Notes (Signed)
Carelink Summary Report / Loop Recorder 

## 2021-01-16 ENCOUNTER — Ambulatory Visit (INDEPENDENT_AMBULATORY_CARE_PROVIDER_SITE_OTHER): Payer: 59

## 2021-01-16 DIAGNOSIS — G459 Transient cerebral ischemic attack, unspecified: Secondary | ICD-10-CM

## 2021-01-17 LAB — CUP PACEART REMOTE DEVICE CHECK
Date Time Interrogation Session: 20220515152835
Implantable Pulse Generator Implant Date: 20220303

## 2021-01-24 ENCOUNTER — Encounter: Payer: Self-pay | Admitting: Cardiology

## 2021-01-24 ENCOUNTER — Other Ambulatory Visit: Payer: Self-pay

## 2021-01-24 ENCOUNTER — Ambulatory Visit: Payer: 59 | Admitting: Cardiology

## 2021-01-24 VITALS — BP 114/74 | Ht 64.6 in | Wt 168.2 lb

## 2021-01-24 DIAGNOSIS — E088 Diabetes mellitus due to underlying condition with unspecified complications: Secondary | ICD-10-CM

## 2021-01-24 DIAGNOSIS — G459 Transient cerebral ischemic attack, unspecified: Secondary | ICD-10-CM

## 2021-01-24 DIAGNOSIS — E7849 Other hyperlipidemia: Secondary | ICD-10-CM | POA: Diagnosis not present

## 2021-01-24 DIAGNOSIS — E781 Pure hyperglyceridemia: Secondary | ICD-10-CM

## 2021-01-24 NOTE — Progress Notes (Signed)
[facility] Office Note:    Date:  01/24/2021   ID:  Anna Combs, DOB 1976-06-04, MRN [id number]  PCP:  Marlyn Corporal, PA  Cardiologist:  Garwin Brothers, MD   Referring MD: Marlyn Corporal, PA    ASSESSMENT:    1. Familial hyperlipidemia   2. TIA (transient ischemic attack)   3. Diabetes mellitus due to underlying condition with unspecified complications (HCC)    PLAN:    In order of problems listed above:  1. Primary prevention stressed with the patient.  Importance of compliance with diet medication stressed and she vocalized understanding. 2. Atherosclerotic vascular disease: She has mild plaquing of the carotid arteries based on [facility] hospital records reviewed that I did.  She will be prescribed aggressive secondary prevention.  Importance of compliance with diet medication stressed and she vocalized understanding.  She was advised to walk at least 2 miles a day on a regular basis and she promises to do so. 3. Familial hyperlipidemia: She will be back in the next few days for blood work.  I am not sure why she did not get her calcium scoring CT scan for restratification.  I am also not aware why she did not pick up her prescription fish oil and initiated.  She will do it now.  I will wait for results of blood work and advise her. 4. Patient will be seen in follow-up appointment in 6 months or earlier if the patient has any concerns    Medication Adjustments/Labs and Tests Ordered: Current medicines are reviewed at length with the patient today.  Concerns regarding medicines are outlined above.  No orders of the defined types were placed in this encounter.  No orders of the defined types were placed in this encounter.    No chief complaint on file.    History of Present Illness:    Anna Combs is a 45 y.o. female.  Patient has past medical history of diabetes mellitus and familial hyperlipidemia.  She had history of TIA like episode and underwent loop recorder  insertion.  Subsequently she is done fine and has had no more episodes.  No chest pain orthopnea or PND.  At the time of my evaluation, the patient is alert awake oriented and in no distress.  Past Medical History:  Diagnosis Date  . Allergic rhinitis   . Asthma   . At high risk for coronary artery disease 02/09/2019  . Diabetes (HCC)   . Diabetes mellitus due to underlying condition with unspecified complications (HCC) 07/26/2020  . Familial hyperlipidemia 02/09/2019  . High cholesterol   . Hypertriglyceridemia   . Insomnia 11/28/2020  . Menopause 11/28/2020  . TIA (transient ischemic attack)   . Type 2 diabetes mellitus without complication (HCC) 01/03/2016    Past Surgical History:  Procedure Laterality Date  . ABDOMINAL HYSTERECTOMY    . BUBBLE STUDY  11/01/2020   Procedure: BUBBLE STUDY;  Surgeon: Chrystie Nose, MD;  [facility]: [facility] ENDOSCOPY;  Service: Cardiovascular;;  . CHOLECYSTECTOMY    . LOOP RECORDER INSERTION N/A 11/01/2020   Procedure: LOOP RECORDER INSERTION;  Surgeon: Hillis Range, MD;  Location: [facility] INVASIVE CV LAB;  Service: Cardiovascular;  Laterality: N/A;  . TEE WITHOUT CARDIOVERSION N/A 11/01/2020   Procedure: TRANSESOPHAGEAL ECHOCARDIOGRAM (TEE);  Surgeon: Chrystie Nose, MD;  Location: [facility] ENDOSCOPY;  Service: Cardiovascular;  Laterality: N/A;  . TUBAL LIGATION      Current Medications: Current Meds  Medication Sig  . albuterol (VENTOLIN HFA) 108 (  90 Base) MCG/ACT inhaler Inhale 2 puffs into the lungs every 6 (six) hours as needed for wheezing or shortness of breath.  Marland Kitchen azelastine (ASTELIN) 0.1 % nasal spray Place 2 sprays into both nostrils 2 (two) times daily.  . Cholecalciferol (VITAMIN D3) 125 MCG (5000 UT) CAPS Take 5,000 Units by mouth daily.  . clopidogrel (PLAVIX) 75 MG tablet Take 75 mg by mouth daily.  . diphenhydrAMINE (BENADRYL) 25 mg capsule Take 50 mg by mouth every 8 (eight) hours as needed for allergies.  . Dulaglutide (TRULICITY) 1.5 MG/0.5ML SOPN  Inject 1.5 mg into the skin every Monday.  . DULoxetine (CYMBALTA) 60 MG capsule Take 60 mg by mouth daily.  Marland Kitchen EPINEPHrine 0.3 MG/0.3ML SOSY Inject 0.3 mg as directed as needed for anaphylaxis.  . fenofibrate (TRICOR) 145 MG tablet Take 145 mg by mouth daily.  . fluconazole (DIFLUCAN) 150 MG tablet Take 150 mg by mouth daily as needed for itching (yeast). For yeast infection  . gabapentin (NEURONTIN) 100 MG capsule Take 100 mg by mouth 3 (three) times daily.  Marland Kitchen ibuprofen (ADVIL,MOTRIN) 800 MG tablet Take 800 mg by mouth every 8 (eight) hours as needed for migraine (migraines).  . metFORMIN (GLUCOPHAGE) 500 MG tablet Take 500 mg by mouth 2 (two) times daily.  . methocarbamol (ROBAXIN) 500 MG tablet Take 500-1,000 mg by mouth 3 (three) times daily as needed for muscle spasms.  . montelukast (SINGULAIR) 10 MG tablet Take 10 mg by mouth daily.  . ondansetron (ZOFRAN-ODT) 4 MG disintegrating tablet Take 4 mg by mouth every 8 (eight) hours as needed for nausea or vomiting.  . rosuvastatin (CRESTOR) 40 MG tablet Take 40 mg by mouth daily.  . vitamin C (ASCORBIC ACID) 250 MG tablet Take 250 mg by mouth 2 (two) times daily.  . Vitamin D, Ergocalciferol, (DRISDOL) 50000 units CAPS capsule Take 50,000 Units by mouth 2 (two) times a week. Mon and Thur  . zinc gluconate 50 MG tablet Take 50 mg by mouth daily.     Allergies:   Alpha-gal, Bee venom, Codeine, Penicillins, Shellfish allergy, Sulfa antibiotics, and Aspirin   Social History   Socioeconomic History  . Marital status: Married    Spouse name: Not on file  . Number of children: Not on file  . Years of education: Not on file  . Highest education level: Not on file  Occupational History  . Not on file  Tobacco Use  . Smoking status: Former Smoker    Quit date: 08/08/2013    Years since quitting: 7.4  . Smokeless tobacco: Never Used  Substance and Sexual Activity  . Alcohol use: Yes    Comment: drinks per year  . Drug use: Not on file  .  Sexual activity: Not on file  Other Topics Concern  . Not on file  Social History Narrative   Lives in [place] with spouse.   unemployed   Social Determinants of Corporate investment banker Strain: Not on file  Food Insecurity: Not on file  Transportation Needs: Not on file  Physical Activity: Not on file  Stress: Not on file  Social Connections: Not on file     Family History: The patient's family history includes Allergic rhinitis in her paternal uncle; Diabetes in her maternal grandmother; Hypertension in her father.  ROS:   Please see the history of present illness.    All other systems reviewed and are negative.  EKGs/Labs/Other Studies Reviewed:    The following studies were reviewed  today: I discussed my findings with the patient at length.   Recent Labs: 11/01/2020: BUN 20; Creatinine, Ser 0.50; Hemoglobin 14.6; Potassium 4.3; Sodium 135  Recent Lipid Panel No results found for: CHOL, TRIG, HDL, CHOLHDL, VLDL, LDLCALC, LDLDIRECT  Physical Exam:    VS:  BP 114/74   Ht 5' 4.6" (1.641 m)   Wt 168 lb 3.2 oz (76.3 kg)   SpO2 99%   BMI 28.34 kg/m     Wt Readings from Last 3 Encounters:  01/24/21 168 lb 3.2 oz (76.3 kg)  11/01/20 164 lb (74.4 kg)  10/17/20 161 lb (73 kg)     GEN: Patient is in no acute distress HEENT: Normal NECK: No JVD; No carotid bruits LYMPHATICS: No lymphadenopathy CARDIAC: Hear sounds regular, 2/6 systolic murmur at the apex. RESPIRATORY:  Clear to auscultation without rales, wheezing or rhonchi  ABDOMEN: Soft, non-tender, non-distended MUSCULOSKELETAL:  No edema; No deformity  SKIN: Warm and dry NEUROLOGIC:  Alert and oriented x 3 PSYCHIATRIC:  Normal affect   Signed, Garwin Brothers, MD  01/24/2021 3:12 PM    [facility]

## 2021-01-24 NOTE — Patient Instructions (Signed)
Medication Instructions:  No medication changes. *If you need a refill on your cardiac medications before your next appointment, please call your pharmacy*   Lab Work: Your physician recommends that you return in the next few days to have labs done. Your test will include  basic metabolic panel, complete blood count, TSH, liver function and lipids. You need to have labs done when you are fasting.  You can come Monday through Friday 8:30 am to 12:00 pm and 1:15 to 4:30. You do not need to make an appointment as the order has already been placed.   If you have labs (blood work) drawn today and your tests are completely normal, you will receive your results only by: Marland Kitchen MyChart Message (if you have MyChart) OR . A paper copy in the mail If you have any lab test that is abnormal or we need to change your treatment, we will call you to review the results.   Testing/Procedures:  We will order CT coronary calcium score. It will cost $99.00 and is not covered by insurance.  Please call [phone] to schedule.   [facility]  [address]  [place], [place] [address]    Follow-Up: At [facility], you and your health needs are our priority.  As part of our continuing mission to provide you with exceptional heart care, we have created designated Provider Care Teams.  These Care Teams include your primary Cardiologist (physician) and Advanced Practice Providers (APPs -  Physician    Follow-Up: At [facility], you and your health needs are our priority.  As part of our continuing mission to provide you with exceptional heart care, we have created designated Provider Care Teams.  These Care Teams include your primary Cardiologist (physician) and Advanced Practice Providers (APPs -  Physician Assistants and Nurse Practitioners) who all work together to provide you with the care you need, when you need it.  We recommend signing up for the patient portal called "MyChart".  Sign up  information is provided on this After Visit Summary.  MyChart is used to connect with patients for Virtual Visits (Telemedicine).  Patients are able to view lab/test results, encounter notes, upcoming appointments, etc.  Non-urgent messages can be sent to your provider as well.   To learn more about what you can do with MyChart, go to [email or web address].    Your next appointment:   6 month(s)  The format for your next appointment:   In Person  Provider:   Belva Crome, MD   Other Instructions  Coronary Calcium Scan A coronary calcium scan is an imaging test used to look for deposits of plaque in the inner lining of the blood vessels of the heart (coronary arteries). Plaque is made up of calcium, protein, and fatty substances. These deposits of plaque can partly clog and narrow the coronary arteries without producing any symptoms or warning signs. This puts a person at risk for a heart attack. This test is recommended for people who are at moderate risk for heart disease. The test can find plaque deposits before symptoms develop. Tell a health care provider about:  Any allergies you have.  All medicines you are taking, including vitamins, herbs, eye drops, creams, and over-the-counter medicines.  Any problems you or family members have had with anesthetic medicines.  Any blood disorders you have.  Any surgeries you have had.  Any medical conditions you have.  Whether you are pregnant or may be pregnant. What are the risks? Generally,  this is a safe procedure. However, problems may occur, including:  Harm to a pregnant woman and her unborn baby. This test involves the use of radiation. Radiation exposure can be dangerous to a pregnant woman and her unborn baby. If you are pregnant or think you may be pregnant, you should not have this procedure done.  Slight increase in the risk of cancer. This is because of the radiation involved in the test. What happens before the  procedure? Ask your health care provider for any specific instructions on how to prepare for this procedure. You may be asked to avoid products that contain caffeine, tobacco, or nicotine for 4 hours before the procedure. What happens during the procedure?  You will undress and remove any jewelry from your neck or chest.  You will put on a hospital gown.  Sticky electrodes will be placed on your chest. The electrodes will be connected to an electrocardiogram (ECG) machine to record a tracing of the electrical activity of your heart.  You will lie down on a curved bed that is attached to the CT scanner.  You may be given medicine to slow down your heart rate so that clear pictures can be created.  You will be moved into the CT scanner, and the CT scanner will take pictures of your heart. During this time, you will be asked to lie still and hold your breath for 2-3 seconds at a time while each picture of your heart is being taken. The procedure may vary among health care providers and hospitals.   What happens after the procedure?  You can get dressed.  You can return to your normal activities.  It is up to you to get the results of your procedure. Ask your health care provider, or the department that is doing the procedure, when your results will be ready. Summary  A coronary calcium scan is an imaging test used to look for deposits of plaque in the inner lining of the blood vessels of the heart (coronary arteries). Plaque is made up of calcium, protein, and fatty substances.  Generally, this is a safe procedure. Tell your health care provider if you are pregnant or may be pregnant.  Ask your health care provider for any specific instructions on how to prepare for this procedure.  A CT scanner will take pictures of your heart.  You can return to your normal activities after the scan is done. This information is not intended to replace advice given to you by your health care provider.  Make sure you discuss any questions you have with your health care provider. Document Revised: 03/10/2019 Document Reviewed: 03/10/2019 Elsevier Patient Education  2021 [employer].

## 2021-01-27 ENCOUNTER — Telehealth: Payer: Self-pay | Admitting: Cardiology

## 2021-01-27 LAB — BASIC METABOLIC PANEL
BUN/Creatinine Ratio: 17 (ref 9–23)
BUN: 11 mg/dL (ref 6–24)
CO2: 18 mmol/L — ABNORMAL LOW (ref 20–29)
Calcium: 9.5 mg/dL (ref 8.7–10.2)
Chloride: 95 mmol/L — ABNORMAL LOW (ref 96–106)
Creatinine, Ser: 0.64 mg/dL (ref 0.57–1.00)
Glucose: 252 mg/dL — ABNORMAL HIGH (ref 65–99)
Potassium: 4.5 mmol/L (ref 3.5–5.2)
Sodium: 131 mmol/L — ABNORMAL LOW (ref 134–144)
eGFR: 111 mL/min/{1.73_m2} (ref >59)

## 2021-01-27 LAB — CBC WITH DIFFERENTIAL/PLATELET
Basophils Absolute: 0 10*3/uL (ref 0.0–0.2)
Basos: 0 %
EOS (ABSOLUTE): 0.1 10*3/uL (ref 0.0–0.4)
Eos: 1 %
Hematocrit: 41.3 % (ref 34.0–46.6)
Hemoglobin: 14.1 g/dL (ref 11.1–15.9)
Immature Grans (Abs): 0.1 10*3/uL (ref 0.0–0.1)
Immature Granulocytes: 1 %
Lymphocytes Absolute: 2.7 10*3/uL (ref 0.7–3.1)
Lymphs: 30 %
MCH: 32 pg (ref 26.6–33.0)
MCHC: 34.1 g/dL (ref 31.5–35.7)
MCV: 94 fL (ref 79–97)
Monocytes Absolute: 0.8 10*3/uL (ref 0.1–0.9)
Monocytes: 9 %
Neutrophils Absolute: 5.3 10*3/uL (ref 1.4–7.0)
Neutrophils: 59 %
Platelets: 338 10*3/uL (ref 150–450)
RBC: 4.4 x10E6/uL (ref 3.77–5.28)
RDW: 12.1 % (ref 11.7–15.4)
WBC: 9 10*3/uL (ref 3.4–10.8)

## 2021-01-27 LAB — HEPATIC FUNCTION PANEL
ALT: 34 IU/L — ABNORMAL HIGH (ref 0–32)
AST: 40 IU/L (ref 0–40)
Albumin: 4.8 g/dL (ref 3.8–4.8)
Alkaline Phosphatase: 68 IU/L (ref 44–121)
Bilirubin Total: 0.3 mg/dL (ref 0.0–1.2)
Bilirubin, Direct: 0.12 mg/dL (ref 0.00–0.40)
Total Protein: 7.1 g/dL (ref 6.0–8.5)

## 2021-01-27 LAB — TSH: TSH: 1.44 u[IU]/mL (ref 0.450–4.500)

## 2021-01-27 LAB — LIPID PANEL
Chol/HDL Ratio: 14.8 ratio — ABNORMAL HIGH (ref 0.0–4.4)
Cholesterol, Total: 341 mg/dL — ABNORMAL HIGH (ref 100–199)
HDL: 23 mg/dL — ABNORMAL LOW (ref >39)
Triglycerides: 1712 mg/dL (ref 0–149)

## 2021-01-27 NOTE — Addendum Note (Signed)
Addended by: Eleonore Chiquito on: 01/27/2021 05:09 PM   Modules accepted: Orders

## 2021-01-27 NOTE — Telephone Encounter (Signed)
Pt would like to know if Dr. Tomie China checked to see if she could take the fish oil he prescribed. Pt is allergic to shellfish and says that Dr. Tomie China was supposed to get back to her on this.  She also wanted to let us know   pt's CT coronary calcium score is on 02/28/2021 @ 9AM    Thank you!  Domingo Dimes

## 2021-01-27 NOTE — Telephone Encounter (Signed)
Left message to callback to let pt know the literature states to use caution when trying Loveza or fish oil with a shellfish allergy.

## 2021-02-08 NOTE — Progress Notes (Signed)
Carelink Summary Report / Loop Recorder 

## 2021-02-15 ENCOUNTER — Telehealth: Payer: Self-pay

## 2021-02-15 ENCOUNTER — Telehealth (INDEPENDENT_AMBULATORY_CARE_PROVIDER_SITE_OTHER): Payer: 59 | Admitting: Internal Medicine

## 2021-02-15 ENCOUNTER — Encounter: Payer: Self-pay | Admitting: Internal Medicine

## 2021-02-15 DIAGNOSIS — E119 Type 2 diabetes mellitus without complications: Secondary | ICD-10-CM

## 2021-02-15 DIAGNOSIS — G459 Transient cerebral ischemic attack, unspecified: Secondary | ICD-10-CM | POA: Diagnosis not present

## 2021-02-15 DIAGNOSIS — E783 Hyperchylomicronemia: Secondary | ICD-10-CM

## 2021-02-15 DIAGNOSIS — E781 Pure hyperglyceridemia: Secondary | ICD-10-CM | POA: Diagnosis not present

## 2021-02-15 MED ORDER — ICOSAPENT ETHYL 1 G PO CAPS: 2.0000 g | ORAL_CAPSULE | Freq: Two times a day (BID) | ORAL | 3 refills | Status: DC

## 2021-02-15 NOTE — Patient Instructions (Signed)
Medication Instructions:  Dr. Rennis Golden advises that you start on Vascepa 2 capsules twice daily  *If you need a refill on your cardiac medications before your next appointment, please call your pharmacy*   Lab Work: FASTING lab work in 3-4 months to check cholesterol & A1c -- complete about 1 week before your next visit with Dr. Rennis Golden   If you have labs (blood work) drawn today and your tests are completely normal, you will receive your results only by: MyChart Message (if you have MyChart) OR A paper copy in the mail If you have any lab test that is abnormal or we need to change your treatment, we will call you to review the results.  Follow-Up: At [facility], you and your health needs are our priority.  As part of our continuing mission to provide you with exceptional heart care, we have created designated Provider Care Teams.  These Care Teams include your primary Cardiologist (physician) and Advanced Practice Providers (APPs -  Physician Assistants and Nurse Practitioners) who all work together to provide you with the care you need, when you need it.  We recommend signing up for the patient portal called "MyChart".  Sign up information is provided on this After Visit Summary.  MyChart is used to connect with patients for Virtual Visits (Telemedicine).  Patients are able to view lab/test results, encounter notes, upcoming appointments, etc.  Non-urgent messages can be sent to your provider as well.   To learn more about what you can do with MyChart, go to [email or web address].    Your next appointment:   3-4 month(s) - lipid clinic  The format for your next appointment:   In Person  Provider:   K. Italy Hilty, MD   Other Instructions

## 2021-02-15 NOTE — Progress Notes (Signed)
Virtual Visit via Video Note   This visit type was conducted due to national recommendations for restrictions regarding the COVID-19 Pandemic (e.g. social distancing) in an effort to limit this patient's exposure and mitigate transmission in our community.  Due to her co-morbid illnesses, this patient is at least at moderate risk for complications without adequate follow up.  This format is felt to be most appropriate for this patient at this time.  All issues noted in this document were discussed and addressed.  A limited physical exam was performed with this format.  Please refer to the patient's chart for her consent to telehealth for [facility].      Date:  02/15/2021   ID:  Anna Combs, DOB 1976/04/03, MRN [id number] The patient was identified using 2 identifiers.  Evaluation Performed:  New Patient Evaluation  Patient Location:  [place] [address]  Provider location:   [address], [address], [place] [address]  PCP:  Renaldo Reel, [place]  Cardiologist:  None Electrophysiologist:  None   Chief Complaint:  High triglycerides  History of Present Illness:    Anna Combs is a 45 y.o. female who presents via audio/video conferencing for a telehealth visit today.  This is a pleasant 45 year old female who I recently met in the hospital as she presented with a TIA.  This was in March 2022.  She underwent a TEE with bubble study by myself which showed a small intrapulmonary shunt but no evidence of intra-atrial shunting or cardiac source of thrombus.  She does have risk factors for stroke including type 2 diabetes which has been poorly controlled as well as high cholesterol and particularly high triglycerides.  There is a family history of heart disease and high triglycerides in her father.  She recently was seen by Dr. Geraldo Pitter, who recommended starting her on high potency rosuvastatin and Vascepa 2 g twice daily.  She did not take the Vascepa  because she says she has a shellfish allergy.  The contraindication to Vascepa is only in fish allergy which is not the same as shellfish.  She is able to eat fish predominantly she has had flounder in the past without any allergic issues.  Vascepa is also very highly purified and unlikely to cause allergy.  She reports she has been making strides to reduce triglycerides in her diet.  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  Her A1c however is also elevated and her fasting blood sugars have been in the 2-300 range and this certainly contributes to her high triglycerides.  The patient does not have symptoms concerning for COVID-19 infection (fever, chills, cough, or new SHORTNESS OF BREATH).    Prior CV studies:   The following studies were reviewed today:  Chart reviewed, lab work  PMHx:  Past Medical History:  Diagnosis Date   Allergic rhinitis    Asthma    At high risk for coronary artery disease 02/09/2019   Diabetes ([place])    Diabetes mellitus due to underlying condition with unspecified complications ([place]) 23/55/7322   Familial hyperlipidemia 02/09/2019   High cholesterol    Hypertriglyceridemia    Insomnia 11/28/2020   Menopause 11/28/2020   TIA (transient ischemic attack)    Type 2 diabetes mellitus without complication ([place]) 0/10/5425    Past Surgical History:  Procedure Laterality Date   ABDOMINAL HYSTERECTOMY     BUBBLE STUDY  11/01/2020   Procedure:  BUBBLE STUDY;  Surgeon: Pixie Casino, MD;  Location: [facility];  Service: Cardiovascular;;   CHOLECYSTECTOMY     LOOP RECORDER INSERTION N/A 11/01/2020   Procedure: LOOP RECORDER INSERTION;  Surgeon: Thompson Grayer, MD;  Location: [facility];  Service: Cardiovascular;  Laterality: N/A;   TEE WITHOUT CARDIOVERSION N/A 11/01/2020   Procedure: TRANSESOPHAGEAL ECHOCARDIOGRAM (TEE);  Surgeon: Pixie Casino, MD;  Location: [facility];   Service: Cardiovascular;  Laterality: N/A;   TUBAL LIGATION      FAMHx:  Family History  Problem Relation Age of Onset   Hypertension Father    Allergic rhinitis Paternal Uncle    Diabetes Maternal Grandmother     SOCHx:   reports that she quit smoking about 7 years ago. Her smoking use included cigarettes. She has never used smokeless tobacco. She reports current alcohol use. No history on file for drug use.  ALLERGIES:  Allergies  Allergen Reactions   Alpha-Gal Anaphylaxis   Bee Venom Anaphylaxis   Codeine Shortness Of Breath   Penicillins Hives   Shellfish Allergy Shortness Of Breath   Sulfa Antibiotics Shortness Of Breath   Aspirin Hives    MEDS:  Current Meds  Medication Sig   albuterol (VENTOLIN HFA) 108 (90 Base) MCG/ACT inhaler Inhale 2 puffs into the lungs every 6 (six) hours as needed for wheezing or shortness of breath.   azelastine (ASTELIN) 0.1 % nasal spray Place 2 sprays into both nostrils 2 (two) times daily.   Cholecalciferol (VITAMIN D3) 125 MCG (5000 UT) CAPS Take 5,000 Units by mouth daily.   clopidogrel (PLAVIX) 75 MG tablet Take 75 mg by mouth daily.   diphenhydrAMINE (BENADRYL) 25 mg capsule Take 50 mg by mouth every 8 (eight) hours as needed for allergies.   Dulaglutide (TRULICITY) 1.5 WL/8.9HT SOPN Inject 1.5 mg into the skin every Monday.   DULoxetine (CYMBALTA) 60 MG capsule Take 60 mg by mouth daily.   EPINEPHrine 0.3 MG/0.3ML SOSY Inject 0.3 mg as directed as needed for anaphylaxis.   famotidine (PEPCID) 40 MG tablet Take 40 mg by mouth 2 (two) times daily.   fenofibrate (TRICOR) 145 MG tablet Take 145 mg by mouth daily.   fluconazole (DIFLUCAN) 150 MG tablet Take 150 mg by mouth daily as needed for itching (yeast). For yeast infection   gabapentin (NEURONTIN) 100 MG capsule Take 100 mg by mouth 3 (three) times daily.   ibuprofen (ADVIL,MOTRIN) 800 MG tablet Take 800 mg by mouth every 8 (eight) hours as needed for migraine (migraines).    icosapent Ethyl (VASCEPA) 1 g capsule Take 2 capsules (2 g total) by mouth 2 (two) times daily.   metFORMIN (GLUCOPHAGE) 500 MG tablet Take 500 mg by mouth 2 (two) times daily.   methocarbamol (ROBAXIN) 500 MG tablet Take 500-1,000 mg by mouth 3 (three) times daily as needed for muscle spasms.   montelukast (SINGULAIR) 10 MG tablet Take 10 mg by mouth daily.   ondansetron (ZOFRAN-ODT) 4 MG disintegrating tablet Take 4 mg by mouth every 8 (eight) hours as needed for nausea or vomiting.   rosuvastatin (CRESTOR) 40 MG tablet Take 40 mg by mouth daily.   vitamin C (ASCORBIC ACID) 250 MG tablet Take 250 mg by mouth 2 (two) times daily.   Vitamin D, Ergocalciferol, (DRISDOL) 50000 units CAPS capsule Take 50,000 Units by mouth 2 (two) times a week. Mon and Thur   zinc gluconate 50 MG tablet Take 50 mg by mouth daily.     ROS:  Pertinent items noted in HPI and remainder of comprehensive ROS otherwise negative.  Labs/Other Tests and Data Reviewed:    Recent Labs: 01/27/2021: ALT 34; BUN 11; Creatinine, Ser 0.64; Hemoglobin 14.1; Platelets 338; Potassium 4.5; Sodium 131; TSH 1.440   Recent Lipid Panel Lab Results  Component Value Date/Time   CHOL 341 (H) 01/27/2021 09:41 AM   TRIG 1,712 (HH) 01/27/2021 09:41 AM   HDL 23 (L) 01/27/2021 09:41 AM   CHOLHDL 14.8 (H) 01/27/2021 09:41 AM   LDLCALC Comment (A) 01/27/2021 09:41 AM    Wt Readings from Last 3 Encounters:  01/24/21 168 lb 3.2 oz (76.3 kg)  11/01/20 164 lb (74.4 kg)  10/17/20 161 lb (73 kg)     Exam:    Vital Signs:  There were no vitals taken for this visit.   General appearance: alert and no distress Lungs: No audible wheezing Abdomen: Overweight Extremities: extremities normal, atraumatic, no cyanosis or edema Skin: Skin color, texture, turgor normal. 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 Horiuchi has a mixed dyslipidemia with very high triglycerides, probably genetic given very high triglycerides in her father but may also be contributed to by her uncontrolled diabetes.  She is working with her PCP to lower her serum blood sugar.  I agree with her primary cardiologist starting rosuvastatin and Vascepa.  She has a history of shellfish but not fish allergy and therefore could not tolerate Vascepa and I encouraged her to take 2 g twice daily.  I will plan repeat lipids in about 3 months including a direct LDL.  She is also been scheduled for calcium score which would be additionally helpful however her target LDL is low given TIA and diabetes.  I will provide dietary information specifically lowering saturated fats which should help with her triglycerides.  Ultimately we may need to consider adding a fibrate or if she remains greater than 500 for triglycerides she may be a candidate for the CORE trial.  Thanks again for this interesting referral.  COVID-19 Education: The signs and symptoms of COVID-19 were discussed with the patient and how to seek care for testing (follow up with PCP or arrange E-visit).  The importance of social distancing was discussed today.  Patient Risk:   After full review of this patients clinical status, I feel that they are at least moderate risk at this time.  Time:   Today, I have spent 25 minutes with the patient with telehealth technology discussing dyslipidemia, familial triglyceride disorders, LDL target, calcium scoring, risk of pancreatitis, dietary modifications to lower triglycerides.     Medication Adjustments/Labs and Tests Ordered: Current medicines are reviewed at length with the patient today.  Concerns regarding medicines are outlined above.   Tests Ordered: Orders Placed This Encounter  Procedures   Lipid panel   LDL cholesterol,  direct   Hemoglobin A1c    Medication Changes: Meds ordered this encounter  Medications   icosapent Ethyl (VASCEPA) 1 g capsule    Sig: Take 2 capsules (2 g total) by mouth 2 (two) times daily.    Dispense:  360 capsule    Refill:  3    Disposition:  in 3 month(s)  Pixie Casino, MD, [facility], [place] Director of the Advanced  Lipid Disorders &  Cardiovascular Risk Reduction Clinic Diplomate of the American Board of Clinical Lipidology Attending Cardiologist  Direct Dial: [phone]  Fax: [phone]  Website:  [email or web address]  Pixie Casino, MD  02/15/2021 9:40 AM

## 2021-02-15 NOTE — Telephone Encounter (Signed)
**Note De-Identified Mckinze Poirier Obfuscation** I started a Vascepa PA through covermymeds. Key: [id number]

## 2021-02-20 ENCOUNTER — Ambulatory Visit (INDEPENDENT_AMBULATORY_CARE_PROVIDER_SITE_OTHER): Payer: 59

## 2021-02-20 DIAGNOSIS — G459 Transient cerebral ischemic attack, unspecified: Secondary | ICD-10-CM

## 2021-02-20 LAB — CUP PACEART REMOTE DEVICE CHECK
Date Time Interrogation Session: 20220617152555
Implantable Pulse Generator Implant Date: 20220303

## 2021-02-20 NOTE — Telephone Encounter (Signed)
**Note De-Identified [place] Prichett Obfuscation** I called [employer] and s/w Dawn. I answered all additional questions they required for this Vascepa PA. Per Dawn they will fax [place] the determination letter within 72 hours provided that they do not need anymore information from [place] and if so she states that they will contact us.

## 2021-02-20 NOTE — Telephone Encounter (Signed)
Additional clinical information needed by [employer]  Fax: [phone] Phone: [phone] Prior Auth # [id number]  Will email to [facility] LPN

## 2021-02-27 NOTE — Telephone Encounter (Signed)
Per message from [facility], patient has bene denied vascepa. Unsure of rationale, will await fax

## 2021-02-28 ENCOUNTER — Other Ambulatory Visit: Payer: Self-pay

## 2021-03-02 NOTE — Telephone Encounter (Signed)
Received fax from [place]  Patient denied Vascepa coverage b/c she has not met ONE of the following criteria for approval: You have tried/failed, or intolerant to, at least [place] alternatives (if available) within the same pharmacological class as the request medication such as omega-3 acid ethyl esters oral capsules 1 gram All formulary alteratives are contraindicated due to existing concurrent conditions or medication therapy  Info submitted was that she had trials of fenofibrate $RemoveBefore'145mg'[id number]$  (current) and rosuvastation $RemoveBeforeDEI'40mg'nEAHaaQCCJgEXmdA$  (current) Could not locate any trials of other antihyperlipidemic meds in history  Appeal: Emily: [employer] [address] [place] [address]  Fax:[phone]

## 2021-03-03 NOTE — Telephone Encounter (Signed)
I would appeal - what formulary alternative are there? Generic lovaza - they didn't mention that.  Dr Rexene Edison

## 2021-03-09 NOTE — Telephone Encounter (Signed)
Patient aware medication appeal will be submitted.

## 2021-03-09 NOTE — Telephone Encounter (Signed)
Appeal letter composed & faxed to [phone]

## 2021-03-13 NOTE — Telephone Encounter (Signed)
Appeal for Vascepa has been approved 03/10/21 - 03/09/22

## 2021-03-13 NOTE — Progress Notes (Signed)
Carelink Summary Report / Loop Recorder 

## 2021-03-13 NOTE — Telephone Encounter (Signed)
Left message for patient that med has been approved and she should contact pharmacy to have filled

## 2021-03-21 ENCOUNTER — Ambulatory Visit: Payer: 59 | Admitting: Diagnostic Neuroimaging

## 2021-03-21 ENCOUNTER — Telehealth: Payer: Self-pay | Admitting: *Deleted

## 2021-03-21 NOTE — Telephone Encounter (Signed)
Patient was no show for follow up appointment today.  

## 2021-03-23 LAB — CUP PACEART REMOTE DEVICE CHECK
Date Time Interrogation Session: 20220720152927
Implantable Pulse Generator Implant Date: 20220303

## 2021-03-27 ENCOUNTER — Ambulatory Visit (INDEPENDENT_AMBULATORY_CARE_PROVIDER_SITE_OTHER): Payer: 59

## 2021-03-27 DIAGNOSIS — G459 Transient cerebral ischemic attack, unspecified: Secondary | ICD-10-CM

## 2021-03-28 ENCOUNTER — Ambulatory Visit (INDEPENDENT_AMBULATORY_CARE_PROVIDER_SITE_OTHER)
Admission: RE | Admit: 2021-03-28 | Discharge: 2021-03-28 | Disposition: A | Discharge status: Home / Self Care | Payer: Self-pay | Source: Ambulatory Visit | Attending: Cardiology | Admitting: Cardiology

## 2021-03-28 ENCOUNTER — Other Ambulatory Visit: Payer: Self-pay

## 2021-03-28 DIAGNOSIS — E7849 Other hyperlipidemia: Secondary | ICD-10-CM

## 2021-03-28 DIAGNOSIS — E088 Diabetes mellitus due to underlying condition with unspecified complications: Secondary | ICD-10-CM

## 2021-04-21 NOTE — Progress Notes (Signed)
Carelink Summary Report / Loop Recorder 

## 2021-05-01 ENCOUNTER — Ambulatory Visit (INDEPENDENT_AMBULATORY_CARE_PROVIDER_SITE_OTHER): Payer: 59

## 2021-05-01 DIAGNOSIS — G459 Transient cerebral ischemic attack, unspecified: Secondary | ICD-10-CM | POA: Diagnosis not present

## 2021-05-02 LAB — CUP PACEART REMOTE DEVICE CHECK
Date Time Interrogation Session: 20220822152639
Implantable Pulse Generator Implant Date: 20220303

## 2021-05-12 NOTE — Progress Notes (Signed)
Carelink Summary Report / Loop Recorder 

## 2021-05-16 LAB — LDL CHOLESTEROL, DIRECT: LDL Direct: 45 mg/dL (ref 0–99)

## 2021-05-16 LAB — LIPID PANEL
Chol/HDL Ratio: 5.5 ratio — ABNORMAL HIGH (ref 0.0–4.4)
Cholesterol, Total: 153 mg/dL (ref 100–199)
HDL: 28 mg/dL — ABNORMAL LOW (ref >39)
LDL Chol Calc (NIH): 29 mg/dL (ref 0–99)
Triglycerides: 724 mg/dL (ref 0–149)
VLDL Cholesterol Cal: 96 mg/dL — ABNORMAL HIGH (ref 5–40)

## 2021-05-16 LAB — HEMOGLOBIN A1C
Est. average glucose Bld gHb Est-mCnc: 235 mg/dL
Hgb A1c MFr Bld: 9.8 % — ABNORMAL HIGH (ref 4.8–5.6)

## 2021-05-17 ENCOUNTER — Ambulatory Visit (INDEPENDENT_AMBULATORY_CARE_PROVIDER_SITE_OTHER): Payer: 59 | Admitting: Internal Medicine

## 2021-05-17 ENCOUNTER — Encounter (HOSPITAL_BASED_OUTPATIENT_CLINIC_OR_DEPARTMENT_OTHER): Payer: Self-pay | Admitting: Internal Medicine

## 2021-05-17 ENCOUNTER — Other Ambulatory Visit: Payer: Self-pay

## 2021-05-17 VITALS — BP 112/76 | HR 101 | Ht 64.0 in | Wt 168.0 lb

## 2021-05-17 DIAGNOSIS — G459 Transient cerebral ischemic attack, unspecified: Secondary | ICD-10-CM | POA: Diagnosis not present

## 2021-05-17 DIAGNOSIS — E119 Type 2 diabetes mellitus without complications: Secondary | ICD-10-CM | POA: Diagnosis not present

## 2021-05-17 DIAGNOSIS — E783 Hyperchylomicronemia: Secondary | ICD-10-CM | POA: Diagnosis not present

## 2021-05-17 MED ORDER — EMPAGLIFLOZIN 10 MG PO TABS: 10.0000 mg | ORAL_TABLET | Freq: Every day | ORAL | 6 refills | Status: AC

## 2021-05-17 NOTE — Patient Instructions (Addendum)
Medication Instructions:  START: JARDIANCE 10mg  DAILY- SAMPLES AND COPAY CARD GIVEN TODAY- MAKE SURE TO STAY WELL HYDRATED WITH THIS MEDICATION   *If you need a refill on your cardiac medications before your next appointment, please call your pharmacy*  Follow-Up: At [facility], you and your health needs are our priority.  As part of our continuing mission to provide you with exceptional heart care, we have created designated Provider Care Teams.  These Care Teams include your primary Cardiologist (physician) and Advanced Practice Providers (APPs -  Physician Assistants and Nurse Practitioners) who all work together to provide you with the care you need, when you need it.  We recommend signing up for the patient portal called "MyChart".  Sign up information is provided on this After Visit Summary.  MyChart is used to connect with patients for Virtual Visits (Telemedicine).  Patients are able to view lab/test results, encounter notes, upcoming appointments, etc.  Non-urgent messages can be sent to your provider as well.   To learn more about what you can do with MyChart, go to [facility].    Your next appointment:   6 month(s) LIPID   The format for your next appointment:   In Person  Provider:   [email or web address] H[place], MD

## 2021-05-18 NOTE — Progress Notes (Signed)
LIPID CLINIC CONSULT NOTE  Chief Complaint:  Follow-up dyslipidemia  Primary Care Physician: Renaldo Reel, PA  Primary Cardiologist:  None  HPI:  Anna Combs is a 45 y.o. female who is being seen today for the evaluation of dyslipidemia at the request of Renaldo Reel, [place]. his is a pleasant 45 year old female who I recently met in the hospital as she presented with a TIA.  This was in March 2022.  She underwent a TEE with bubble study by myself which showed a small intrapulmonary shunt but no evidence of intra-atrial shunting or cardiac source of thrombus.  She does have risk factors for stroke including type 2 diabetes which has been poorly controlled as well as high cholesterol and particularly high triglycerides.  There is a family history of heart disease and high triglycerides in her father.  She recently was seen by Dr. Geraldo Pitter, who recommended starting her on high potency rosuvastatin and Vascepa 2 g twice daily.  She did not take the Vascepa because she says she has a shellfish allergy.  The contraindication to Vascepa is only in fish allergy which is not the same as shellfish.  She is able to eat fish predominantly she has had flounder in the past without any allergic issues.  Vascepa is also very highly purified and unlikely to cause allergy.  She reports she has been making strides to reduce triglycerides in her diet.  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  Her A1c however is also elevated and her fasting blood sugars have been in the 2-300 range and this certainly contributes to her high triglycerides.  05/18/2021  Anna Combs returns today for follow-up of her dyslipidemia and high triglycerides.  Recent labs showed a direct LDL of 45, and significant improvement in her cholesterol with total 153 (down from 341), triglycerides 724 (down from 1712) and an HDL of 28.  Overall these are  significant improvements with medical therapy however her triglycerides remain high.  She also underwent calcium scoring in July 2022 which showed a calcium score of 3, that was very elevated for her age and put her at [place] percentile.  No significant incidental findings were noted except for her loop recorder which has been placed with prior TIA.  PMHx:  Past Medical History:  Diagnosis Date   Allergic rhinitis    Asthma    At high risk for coronary artery disease 02/09/2019   Diabetes ([place])    Diabetes mellitus due to underlying condition with unspecified complications ([place]) 36/64/4034   Familial hyperlipidemia 02/09/2019   High cholesterol    Hypertriglyceridemia    Insomnia 11/28/2020   Menopause 11/28/2020   TIA (transient ischemic attack)    Type 2 diabetes mellitus without complication ([place]) 03/06/2594    Past Surgical History:  Procedure Laterality Date   ABDOMINAL HYSTERECTOMY     BUBBLE STUDY  11/01/2020   Procedure: BUBBLE STUDY;  Surgeon: Pixie Casino, MD;  Location: [facility];  Service: Cardiovascular;;   CHOLECYSTECTOMY     LOOP RECORDER INSERTION N/A 11/01/2020   Procedure: LOOP RECORDER INSERTION;  Surgeon: Thompson Grayer, MD;  Location: [facility];  Service: Cardiovascular;  Laterality: N/A;   TEE WITHOUT CARDIOVERSION N/A 11/01/2020   Procedure: TRANSESOPHAGEAL ECHOCARDIOGRAM (TEE);  Surgeon: Pixie Casino, MD;  Location: [facility];  Service: Cardiovascular;  Laterality: N/A;   TUBAL LIGATION      FAMHx:  Family  History  Problem Relation Age of Onset   Hypertension Father    Allergic rhinitis Paternal Uncle    Diabetes Maternal Grandmother     SOCHx:   reports that she quit smoking about 7 years ago. Her smoking use included cigarettes. She has never used smokeless tobacco. She reports current alcohol use. No history on file for drug use.  ALLERGIES:  Allergies  Allergen Reactions   Alpha-Gal Anaphylaxis   Bee Venom Anaphylaxis   Codeine  Shortness Of Breath   Penicillins Hives   Shellfish Allergy Shortness Of Breath   Sulfa Antibiotics Shortness Of Breath   Aspirin Hives    ROS: Pertinent items noted in HPI and remainder of comprehensive ROS otherwise negative.  HOME MEDS: Current Outpatient Medications on File Prior to Visit  Medication Sig Dispense Refill   albuterol (VENTOLIN HFA) 108 (90 Base) MCG/ACT inhaler Inhale 2 puffs into the lungs every 6 (six) hours as needed for wheezing or shortness of breath.     azelastine (ASTELIN) 0.1 % nasal spray Place 2 sprays into both nostrils 2 (two) times daily.     Cholecalciferol (VITAMIN D3) 125 MCG (5000 UT) CAPS Take 5,000 Units by mouth daily.     clopidogrel (PLAVIX) 75 MG tablet Take 75 mg by mouth daily.     diphenhydrAMINE (BENADRYL) 25 mg capsule Take 50 mg by mouth every 8 (eight) hours as needed for allergies.     Dulaglutide (TRULICITY) 1.5 MO/2.9UT SOPN Inject 1.5 mg into the skin every Monday.     DULoxetine (CYMBALTA) 60 MG capsule Take 60 mg by mouth daily.     EPINEPHrine 0.3 MG/0.3ML SOSY Inject 0.3 mg as directed as needed for anaphylaxis.     famotidine (PEPCID) 40 MG tablet Take 40 mg by mouth 2 (two) times daily.     fenofibrate (TRICOR) 145 MG tablet Take 145 mg by mouth daily.     fluconazole (DIFLUCAN) 150 MG tablet Take 150 mg by mouth daily as needed for itching (yeast). For yeast infection     gabapentin (NEURONTIN) 100 MG capsule Take 100 mg by mouth 3 (three) times daily.     ibuprofen (ADVIL,MOTRIN) 800 MG tablet Take 800 mg by mouth every 8 (eight) hours as needed for migraine (migraines).  0   icosapent Ethyl (VASCEPA) 1 g capsule Take 2 capsules (2 g total) by mouth 2 (two) times daily. 360 capsule 3   metFORMIN (GLUCOPHAGE) 500 MG tablet Take 500 mg by mouth 2 (two) times daily.  2   methocarbamol (ROBAXIN) 500 MG tablet Take 500-1,000 mg by mouth 3 (three) times daily as needed for muscle spasms.     montelukast (SINGULAIR) 10 MG tablet Take  10 mg by mouth daily.     ondansetron (ZOFRAN-ODT) 4 MG disintegrating tablet Take 4 mg by mouth every 8 (eight) hours as needed for nausea or vomiting.     rosuvastatin (CRESTOR) 40 MG tablet Take 40 mg by mouth daily.     vitamin C (ASCORBIC ACID) 250 MG tablet Take 250 mg by mouth 2 (two) times daily.     Vitamin D, Ergocalciferol, (DRISDOL) 50000 units CAPS capsule Take 50,000 Units by mouth 2 (two) times a week. Mon and Thur  1   zinc gluconate 50 MG tablet Take 50 mg by mouth daily.     No current facility-administered medications on file prior to visit.    LABS/IMAGING: No results found for this or any previous visit (from the past 48 hour(s)).  No results found.  LIPID PANEL:    Component Value Date/Time   CHOL 153 05/16/2021 0809   TRIG 724 (HH) 05/16/2021 0809   HDL 28 (L) 05/16/2021 0809   CHOLHDL 5.5 (H) 05/16/2021 0809   LDLCALC 29 05/16/2021 0809   LDLDIRECT 45 05/16/2021 0809    WEIGHTS: Wt Readings from Last 3 Encounters:  05/17/21 168 lb (76.2 kg)  01/24/21 168 lb 3.2 oz (76.3 kg)  11/01/20 164 lb (74.4 kg)    VITALS: BP 112/76   Pulse (!) 101   Ht _0  (1.626 m)   Wt 168 lb (76.2 kg)   SpO2 99%   BMI 28.84 kg/m   EXAM: General appearance: alert and no distress Neck: no carotid bruit, no JVD, and thyroid not enlarged, symmetric, no tenderness/mass/nodules Lungs: clear to auscultation bilaterally Heart: regular rate and rhythm, S1, S2 normal, no murmur, click, rub or gallop Abdomen: Overweight Extremities: extremities normal, atraumatic, no cyanosis or edema Pulses: 2+ and symmetric Skin: Skin color, texture, turgor normal. 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   Anna Combs has had marked  improvement in her lipids but still remains well above target.  Her triglycerides are in the 700s but previously were in the 1700s.  Overall I think she is on a good medical regimen but likely will need additional therapy to lower her triglycerides further.  I would recommend a referral to our CORE study as she will likely be a good candidate. Plan follow-up with me in 6 months.  Pixie Casino, MD, [facility], [place] Director of the Advanced Lipid Disorders &  Cardiovascular Risk Reduction Clinic Diplomate of the American Board of Clinical Lipidology Attending Cardiologist  Direct Dial: [phone]  Fax: [phone]  Website:  [email or web address] 05/18/2021, 10:40 AM

## 2021-05-19 ENCOUNTER — Telehealth: Payer: Self-pay

## 2021-05-19 NOTE — Telephone Encounter (Signed)
Called pt, offered her an appointment next week, she could not come then. She is scheduled for 06/14/21 to come in for a screening visit. Emailed parking information and consent forms for her to review.

## 2021-05-19 NOTE — Telephone Encounter (Signed)
-----   Message from Chrystie Nose, MD sent at 05/18/2021 10:46 AM EDT ----- Regarding: CORE Patient Please reach out to this patient ASAP for the CORE trial - she is a good candidate.  Thanks.  Dr. Rennis Golden

## 2021-05-24 ENCOUNTER — Telehealth: Payer: Self-pay | Admitting: Internal Medicine

## 2021-05-24 DIAGNOSIS — E119 Type 2 diabetes mellitus without complications: Secondary | ICD-10-CM

## 2021-05-24 DIAGNOSIS — Z79899 Other long term (current) drug therapy: Secondary | ICD-10-CM

## 2021-05-24 NOTE — Telephone Encounter (Signed)
Lab slips for BMET (2 weeks after starting Jardiance) and A1c (6 months) mailed  Letter mailed with info on what these are ordered   Per staff message from MD: Chrystie Nose, MD  Lindell Spar, RN Yes thanks .Marland Kitchen BMET in 2 weeks - check A1c in 6 months.   Dr Rexene Edison

## 2021-05-31 ENCOUNTER — Telehealth: Payer: Self-pay | Admitting: Internal Medicine

## 2021-05-31 NOTE — Telephone Encounter (Signed)
Patient requesting her lab results be sent to her PCP Graylon Gunning' office in [place]. She would like a call back when it has been sent and says it is okay to leave a detailed voicemail.

## 2021-05-31 NOTE — Telephone Encounter (Signed)
Returned call to patient, made patient aware that results have been faxed via [employer], and to call back with any issues. Patient verbalized understanding.

## 2021-06-05 ENCOUNTER — Ambulatory Visit (INDEPENDENT_AMBULATORY_CARE_PROVIDER_SITE_OTHER): Payer: 59

## 2021-06-05 DIAGNOSIS — G459 Transient cerebral ischemic attack, unspecified: Secondary | ICD-10-CM

## 2021-06-05 LAB — CUP PACEART REMOTE DEVICE CHECK
Date Time Interrogation Session: 20220924152441
Implantable Pulse Generator Implant Date: 20220303

## 2021-06-13 NOTE — Progress Notes (Signed)
Carelink Summary Report / Loop Recorder 

## 2021-06-14 ENCOUNTER — Other Ambulatory Visit: Payer: Self-pay

## 2021-06-14 VITALS — BP 125/76 | HR 94 | Temp 98.3°F | Ht 64.5 in | Wt 168.0 lb

## 2021-06-14 DIAGNOSIS — Z006 Encounter for examination for normal comparison and control in clinical research program: Secondary | ICD-10-CM

## 2021-06-14 NOTE — Research (Addendum)
     Screening Run-In Clinic Visit    Date of Visit: 06/14/2021   Subject #: _[id number]      During this visit the following activities were completed:  _0 Reading, Signing and Understanding the informed Consent   _1 Review Inclusion/Exclusion Criteria  _2 Vital Signs, Height, & Weight:  - Blood pressure: 125/76 (Subject sat supine for at least 5 minutes before blood pressure was performed) - Heart rate: 94 - Temperature:98.3 - Respiratory Rate:18 - Oxygen Saturation:99% - Weight:168 lb - Height:5' 4.5"  _3 Physical Exam done by PI or Sub-I  _4 Review Subjects Medical History & Concomitant Medications  _5 Review Any Adverse Events/ Serious Adverse Events  _6 Review of any ER Visits, Hospitalizations and Inpatient Days  _7 12-Lead ECG (Subject sat supine for at least 5 minutes before this was performed)  *All ECG's completed will be available in subjects binder   _8  Subject fasting   _9  Blood and Urine specimens collected per protocol   _10 Genetic Testing Completed (Only for patients with suspected FCS)  _11 Extended Urinalysis/Pregnancy Test (if woman of childbearing age) pt had a hysterectomy   _12 Diet/Lifestyle/Alcohol Counseling with Subject  _13 FCS Symptoms 2 Week Recall  _14 Education/teaching subject on importance of completing the daily diary   _15 Education on the importance of complying with contraception precautions during study with subject agreement   Subject came into the research clinic today (06/14/2021) for the Screening Run-In Visit for the CORE research study.  Subject signed the informed consent under protocol amendment 2, Version 23 Nov 2020, before any assessments were completed.     Subject Name: Anna Combs   Subject met inclusion and exclusion criteria.  The informed consent form, study requirements and expectations were reviewed with the subject and questions and concerns were addressed prior to the signing of the consent form.  The  subject verbalized understanding of the trial requirements.  The subject agreed to participate in the CORE trial and signed the informed consent at time on date  The informed consent was obtained prior to performance of any protocol-specific procedures for the subject.  A copy of the signed informed consent was given to the subject and a copy was placed in the subject's medical record.   As the principal investigator of the CORE trial at [facility], I have reviewed the patients history, indications for the trial, labwork and other factors and find that they meet criteria for enrollment in the study. The patient was provided, reviewed and comprehended written consent to participate in the study and is agreeable to proceed.  General appearance: alert and no distress Neck: no carotid bruit, no JVD, and thyroid not enlarged, symmetric, no tenderness/mass/nodules Lungs: clear to auscultation bilaterally Heart: regular rate and rhythm, S1, S2 normal, no murmur, click, rub or gallop Abdomen: soft, non-tender; bowel sounds normal; no masses,  no organomegaly Extremities: extremities normal, atraumatic, no cyanosis or edema Pulses: 2+ and symmetric Skin: Skin color, texture, turgor normal. No rashes or lesions Neurologic: Grossly normal Psych: Pleasant   Pixie Casino, MD, FACC, [place] Director of the Advanced Lipid Disorders &  Cardiovascular Risk Reduction Clinic Diplomate of the American Board of Clinical Lipidology Attending Cardiologist  Direct Dial: [phone]  Fax: [phone]  Website:  [email or web address]

## 2021-06-16 NOTE — Progress Notes (Addendum)
CORE Abnormal Lab report June 16, 2021  Chemistry: Sodium                                                132 mmol/L         [] Clinically Significant  [x] Not Clinically Significant  Magnesium                                            1.7 mg/dL          [] Clinically Significant  [x] Not Clinically Significant Glucose                                              193 mg/dL            [] Clinically Significant  [x] Not Clinically Significant Insulin                                                    26.7  U/mL         [] Clinically Significant  [x] Not Clinically Significant Hemoglobin A1c                                    9.7%                 [x] Clinically Significant  [] Not Clinically Significant Gamma Glutamyl Transfers (GGT)        90 U/L               [] Clinically Significant  [x] Not Clinically Significant Hs-C-Reactive protein                            3.3mg /L             [] Clinically Significant  [x] Not Clinically Significant   Lipids: Total Cholesterol                                   239 mg/dL          [] Clinically Significant  [x] Not Clinically Significant Triglyceride                                           1471 mg/dL         [] Clinically Significant  [x] Not Clinically Significant HDL Cholesterol (ppt)                              28  mg/dL         [] Clinically Significant  [x] Not Clinically Significant Apolipoprotein CIII  77.32 mg/dL       [] Clinically Significant  [x] Not Clinically Significant Non-HDL Cholesterol (calc)                   211 mg/dL          [] Clinically Significant  [x] Not Clinically Significant  Urinalysis: Glucose                                          >1000  mg/dL            [] Clinically Significant  [x] Not Clinically Significant Ketone                                                  10mg /dL              [] Clinically Significant  [x] Not Clinically Significant  Urine Chemistry: Urine Albumin mg/dL                               9.66 mg/dL          [] Clinically Significant  [x] Not Clinically Significant   Any further action needed to be taken per the PI? Please see the patients Hgb A1c, per protocol it must be <9.5, patient will be a screen fail due to hers is 9.7.  A1c higher than cutoff - needs better glycemic control - advise she follow-up with PCP or endocrinologist to improve glycemic control - could possibly consider her at a future date if this is lower.  , MD, [facility], FACP  [place]  [facility]  Medical Director of the Advanced Lipid Disorders &  Cardiovascular Risk Reduction Clinic Diplomate of the American Board of Clinical Lipidology Attending Cardiologist  Direct Dial: [phone]  Fax: [phone]  Website:  [email or web address]

## 2021-06-19 NOTE — Progress Notes (Signed)
Tried to call pt- NA- LMOM 

## 2021-06-20 NOTE — Progress Notes (Signed)
Tried to call pt- NA-LMOM to return call.  

## 2021-06-22 NOTE — Progress Notes (Signed)
Tried to call pt- NA LMOM asking her to return call. I did leave a message that we have to cancel her appointment for next week and that I will be out of the office until next Thursday and asked her to return the call today so I could speak with her. Appt for next week has been cancelled.

## 2021-07-05 LAB — CUP PACEART REMOTE DEVICE CHECK
Date Time Interrogation Session: 20221027152503
Implantable Pulse Generator Implant Date: 20220303

## 2021-07-10 ENCOUNTER — Ambulatory Visit (INDEPENDENT_AMBULATORY_CARE_PROVIDER_SITE_OTHER): Payer: Managed Care, Other (non HMO)

## 2021-07-10 DIAGNOSIS — G459 Transient cerebral ischemic attack, unspecified: Secondary | ICD-10-CM

## 2021-07-18 NOTE — Progress Notes (Signed)
Carelink Summary Report / Loop Recorder 

## 2021-08-09 LAB — CUP PACEART REMOTE DEVICE CHECK
Date Time Interrogation Session: 20221129152540
Implantable Pulse Generator Implant Date: 20220303

## 2021-08-14 ENCOUNTER — Ambulatory Visit (INDEPENDENT_AMBULATORY_CARE_PROVIDER_SITE_OTHER): Payer: Managed Care, Other (non HMO)

## 2021-08-14 DIAGNOSIS — G459 Transient cerebral ischemic attack, unspecified: Secondary | ICD-10-CM | POA: Diagnosis not present

## 2021-08-24 NOTE — Progress Notes (Signed)
Carelink Summary Report / Loop Recorder 

## 2021-09-18 ENCOUNTER — Ambulatory Visit (INDEPENDENT_AMBULATORY_CARE_PROVIDER_SITE_OTHER): Payer: Managed Care, Other (non HMO)

## 2021-09-18 DIAGNOSIS — G459 Transient cerebral ischemic attack, unspecified: Secondary | ICD-10-CM

## 2021-09-18 LAB — CUP PACEART REMOTE DEVICE CHECK
Date Time Interrogation Session: 20230115230707
Implantable Pulse Generator Implant Date: 20220303

## 2021-09-29 NOTE — Progress Notes (Signed)
Carelink Summary Report / Loop Recorder 

## 2021-10-13 ENCOUNTER — Encounter: Payer: Self-pay | Admitting: *Deleted

## 2021-10-13 DIAGNOSIS — Z006 Encounter for examination for normal comparison and control in clinical research program: Secondary | ICD-10-CM

## 2021-10-13 NOTE — Research (Signed)
Message left on [employer] with information about Core research, to see if she would want to be re screened. Encouraged her to call me back.

## 2021-10-21 LAB — CUP PACEART REMOTE DEVICE CHECK
Date Time Interrogation Session: 20230217230522
Implantable Pulse Generator Implant Date: 20220303

## 2021-10-23 ENCOUNTER — Ambulatory Visit (INDEPENDENT_AMBULATORY_CARE_PROVIDER_SITE_OTHER): Payer: Managed Care, Other (non HMO)

## 2021-10-23 DIAGNOSIS — G459 Transient cerebral ischemic attack, unspecified: Secondary | ICD-10-CM

## 2021-10-30 NOTE — Progress Notes (Signed)
Carelink Summary Report / Loop Recorder 

## 2021-10-31 ENCOUNTER — Other Ambulatory Visit: Payer: Self-pay | Admitting: Family Medicine

## 2021-10-31 DIAGNOSIS — Z1231 Encounter for screening mammogram for malignant neoplasm of breast: Secondary | ICD-10-CM

## 2021-11-01 ENCOUNTER — Other Ambulatory Visit: Payer: Self-pay | Admitting: Family Medicine

## 2021-11-01 DIAGNOSIS — Z1231 Encounter for screening mammogram for malignant neoplasm of breast: Secondary | ICD-10-CM

## 2021-11-13 ENCOUNTER — Other Ambulatory Visit: Payer: Self-pay | Admitting: *Deleted

## 2021-11-13 NOTE — Research (Signed)
Review of medications Albuterol Sulfate ordered 12-14-13 for shortness of breath see note Azelastine HCL ordered 01-24-21 for allergies Cholecalciferol ordered 04-30-06 for dev=creased Vitamin D levels Clopidogrel Bisulfate ordered 07-26-20 for TIA Diphenhydramine HCl ordered 12-14-13 for allergies  Dulaglutide ordered 02-19-19 for Type 2 diabetes Duloxetine HCL ordered 07-25-20 for anxiety Empagliflozin ordered 05-17-21 for type 2 diabetes  Epinephrine ordered 04-30-16 for allergic reaction Ergocalciferol ordered 12-14-13 for low vitamin D Famotidine ordered 07-26-20 for reflux Fenofibrate ordered 07-26-20 for high cholesterol Fluconazole ordered 10-31-20 prn yeast infection Gabapentin ordered 04-21-20 for TIA Ibuprofen ordered 03-29-16 for pain Icosapent Ethyl ordered 02-09-19 for high triglyceride levels Metformin HCL ordered 04-30-16 for type 2 diabetes Methocarbamol ordered 01-22-21 for muscle spasms Montelukast Sodium ordered 09-15-13 for sinus pressure and drainage Ondansetron ordered 01-24-21 for nausea and vomiting Rosuvastatin calcium ordered 04-30-16 for high Cholesterol Zinc Gluconate ordered 07-26-20 for supplement Vitamin C ordered 01-24-21 for supplements

## 2021-11-14 ENCOUNTER — Other Ambulatory Visit: Payer: Self-pay

## 2021-11-14 ENCOUNTER — Ambulatory Visit (INDEPENDENT_AMBULATORY_CARE_PROVIDER_SITE_OTHER): Payer: Managed Care, Other (non HMO) | Admitting: Internal Medicine

## 2021-11-14 ENCOUNTER — Encounter (HOSPITAL_BASED_OUTPATIENT_CLINIC_OR_DEPARTMENT_OTHER): Payer: Self-pay | Admitting: Internal Medicine

## 2021-11-14 VITALS — BP 105/72 | HR 93 | Ht 64.0 in | Wt 167.5 lb

## 2021-11-14 DIAGNOSIS — E119 Type 2 diabetes mellitus without complications: Secondary | ICD-10-CM

## 2021-11-14 DIAGNOSIS — G459 Transient cerebral ischemic attack, unspecified: Secondary | ICD-10-CM

## 2021-11-14 DIAGNOSIS — E783 Hyperchylomicronemia: Secondary | ICD-10-CM | POA: Diagnosis not present

## 2021-11-14 NOTE — Progress Notes (Signed)
? ? ?LIPID CLINIC CONSULT NOTE ? ?Chief Complaint:  ?Follow-up dyslipidemia ? ?Primary Care Physician: ?Anna Reel, PA ? ?Primary Cardiologist:  ?None ? ?HPI:  ?Anna Combs is a 46 y.o. female who is being seen today for the evaluation of dyslipidemia at the request of Anna Combs, [place]. his is a pleasant 46 year old female who I recently met in the hospital as she presented with a TIA.  This was in March 2022.  She underwent a TEE with bubble study by myself which showed a small intrapulmonary shunt but no evidence of intra-atrial shunting or cardiac source of thrombus.  She does have risk factors for stroke including type 2 diabetes which has been poorly controlled as well as high cholesterol and particularly high triglycerides.  There is a family history of heart disease and high triglycerides in her father.  She recently was seen by Dr. Geraldo Pitter, who recommended starting her on high potency rosuvastatin and Vascepa 2 g twice daily.  She did not take the Vascepa because she says she has a shellfish allergy.  The contraindication to Vascepa is only in fish allergy which is not the same as shellfish.  She is able to eat fish predominantly she has had flounder in the past without any allergic issues.  Vascepa is also very highly purified and unlikely to cause allergy.  She reports she has been making strides to reduce triglycerides in her diet.  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  Her A1c however is also elevated and her fasting blood sugars have been in the 2-300 range and this certainly contributes to her high triglycerides. ? ?05/18/2021 ? ?Anna Combs returns today for follow-up of her dyslipidemia and high triglycerides.  Recent labs showed a direct LDL of 45, and significant improvement in her cholesterol with total 153 (down from 341), triglycerides 724 (down from 1712) and an HDL of 28.  Overall these are  significant improvements with medical therapy however her triglycerides remain high.  She also underwent calcium scoring in July 2022 which showed a calcium score of 3, that was very elevated for her age and put her at [place] percentile.  No significant incidental findings were noted except for her loop recorder which has been placed with prior TIA. ? ?11/14/2021 ? ?Anna Combs is seen today in follow-up.  She was referred from the core triglyceride research study, however she did not qualify as her hemoglobin A1c was greater than 9.5%.  She has now been switched over to Rybelsus and some other diabetes medications and her A1c is coming down.  It was actually 9.5% in January.  Unfortunately triglycerides remain very high in February at 3387 with total cholesterol 509 and HDL 13.  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 ? ?PMHx:  ?Past Medical History:  ?Diagnosis Date  ? Allergic rhinitis   ? Asthma   ? At high risk for coronary artery disease 02/09/2019  ? Diabetes ([place])   ? Diabetes mellitus due to underlying condition with unspecified complications ([place]) 06/09/1218  ? Familial hyperlipidemia 02/09/2019  ? High cholesterol   ? Hypertriglyceridemia   ? Insomnia 11/28/2020  ? Menopause 11/28/2020  ? TIA (transient ischemic attack)   ? Type 2 diabetes mellitus without complication ([place]) 03/08/8831  ? ? ?Past Surgical History:  ?Procedure Laterality Date  ? ABDOMINAL HYSTERECTOMY    ? BUBBLE STUDY  11/01/2020  ? Procedure: BUBBLE STUDY;  Surgeon: Pixie Casino, MD;  Location: [facility] ENDOSCOPY;  Service: Cardiovascular;;  ? CHOLECYSTECTOMY    ? LOOP RECORDER INSERTION N/A 11/01/2020  ? Procedure: LOOP RECORDER INSERTION;  Surgeon: Thompson Grayer, MD;  Location: [place] CV LAB;  Service: Cardiovascular;  Laterality: N/A;  ? TEE WITHOUT CARDIOVERSION N/A 11/01/2020  ? Procedure: TRANSESOPHAGEAL ECHOCARDIOGRAM (TEE);  Surgeon: Pixie Casino, MD;  Location: [facility] ENDOSCOPY;  Service: Cardiovascular;  Laterality: N/A;  ? TUBAL LIGATION     ? ? ?FAMHx:  ?Family History  ?Problem Relation Age of Onset  ? Hypertension Father   ? Allergic rhinitis Paternal Uncle   ? Diabetes Maternal Grandmother   ? ? ?SOCHx:  ? reports that she quit smoking about 8 years ago. Her smoking use included cigarettes. She has never used smokeless tobacco. She reports current alcohol use. No history on file for drug use. ? ?ALLERGIES:  ?Allergies  ?Allergen Reactions  ? Alpha-Gal Anaphylaxis  ? Bee Venom Anaphylaxis  ? Codeine Shortness Of Breath  ? Penicillins Hives  ? Shellfish Allergy Shortness Of Breath  ? Sulfa Antibiotics Shortness Of Breath  ? Aspirin Hives  ? ? ?ROS: ?Pertinent items noted in HPI and remainder of comprehensive ROS otherwise negative. ? ?HOME MEDS: ?Current Outpatient Medications on File Prior to Visit  ?Medication Sig Dispense Refill  ? albuterol (VENTOLIN HFA) 108 (90 Base) MCG/ACT inhaler Inhale 2 puffs into the lungs every 6 (six) hours as needed for wheezing or shortness of breath.    ? ALPRAZolam (XANAX) 0.25 MG tablet Take 0.25 mg by mouth as needed.    ? azelastine (ASTELIN) 0.1 % nasal spray Place 2 sprays into both nostrils 2 (two) times daily.    ? Cholecalciferol (VITAMIN D3) 125 MCG (5000 UT) CAPS Take 5,000 Units by mouth daily.    ? clopidogrel (PLAVIX) 75 MG tablet Take 75 mg by mouth daily.    ? diphenhydrAMINE (BENADRYL) 25 mg capsule Take 50 mg by mouth every 8 (eight) hours as needed for allergies.    ? DULoxetine (CYMBALTA) 60 MG capsule Take 60 mg by mouth daily.    ? empagliflozin (JARDIANCE) 10 MG TABS tablet Take 1 tablet (10 mg total) by mouth daily before breakfast. 30 tablet 6  ? EPINEPHrine 0.3 MG/0.3ML SOSY Inject 0.3 mg as directed as needed for anaphylaxis.    ? famotidine (PEPCID) 40 MG tablet Take 40 mg by mouth 2 (two) times daily.    ? fenofibrate (TRICOR) 145 MG tablet Take 145 mg by mouth daily.    ? fluconazole (DIFLUCAN) 150 MG tablet Take 150 mg by mouth daily as needed for itching (yeast). For yeast infection     ? gabapentin (NEURONTIN) 100 MG capsule Take 100 mg by mouth 3 (three) times daily.    ? ibuprofen (ADVIL,MOTRIN) 800 MG tablet Take 800 mg by mouth every 8 (eight) hours as needed for migraine (migraines).  0  ? icosapent Ethyl (VASCEPA) 1 g capsule Take 2 capsules (2 g total) by mouth 2 (two) times daily. 360 capsule 3  ? methocarbamol (ROBAXIN) 500 MG tablet Take 500-1,000 mg by mouth 3 (three) times daily as needed for muscle spasms.    ? montelukast (SINGULAIR) 10 MG tablet Take 10 mg by mouth daily.    ? ondansetron (ZOFRAN-ODT) 4 MG disintegrating tablet Take 4 mg by mouth every 8 (eight) hours as needed for nausea or vomiting.    ? progesterone (PROMETRIUM) 100 MG capsule     ? rosuvastatin (CRESTOR) 40 MG tablet Take 40  mg by mouth daily.    ? vitamin C (ASCORBIC ACID) 250 MG tablet Take 250 mg by mouth 2 (two) times daily.    ? Vitamin D, Ergocalciferol, (DRISDOL) 50000 units CAPS capsule Take 50,000 Units by mouth 2 (two) times a week. Mon and Thur  1  ? zinc gluconate 50 MG tablet Take 50 mg by mouth daily.    ? Dulaglutide (TRULICITY) 1.5 UY/3.7QD SOPN Inject 1.5 mg into the skin every Wednesday. (Patient not taking: Reported on 11/14/2021)    ? finasteride (PROSCAR) 5 MG tablet Take 2.5 mg by mouth daily.    ? metFORMIN (GLUCOPHAGE) 500 MG tablet Take 1,000 mg by mouth 2 (two) times daily. (Patient not taking: Reported on 11/14/2021)  2  ? RYBELSUS 14 MG TABS Take 1 tablet by mouth every morning.    ? ?No current facility-administered medications on file prior to visit.  ? ? ?LABS/IMAGING: ?No results found for this or any previous visit (from the past 48 hour(s)). ?No results found. ? ?LIPID PANEL: ?   ?Component Value Date/Time  ? CHOL 153 05/16/2021 0809  ? TRIG 724 (HH) 05/16/2021 0809  ? HDL 28 (L) 05/16/2021 0809  ? CHOLHDL 5.5 (H) 05/16/2021 0809  ? [place] 29 05/16/2021 0809  ? LDLDIRECT 45 05/16/2021 0809  ? ? ?WEIGHTS: ?Wt Readings from Last 3 Encounters:  ?11/14/21 167 lb 8 oz (76 kg)   ?06/14/21 168 lb (76.2 kg)  ?05/17/21 168 lb (76.2 kg)  ? ? ?VITALS: ?BP 105/72   Pulse 93   Ht _0  (1.626 m)   Wt 167 lb 8 oz (76 kg)   SpO2 97%   BMI 28.75 kg/m?  ? ?EXAM: ?Deferred ? ?EKG: ?Deferred ? ?ASSESSMENT:

## 2021-11-14 NOTE — Patient Instructions (Signed)
Medication Instructions:  ?Continue current medications ? ?*If you need a refill on your cardiac medications before your next appointment, please call your pharmacy* ? ? ?Lab Work: ?None Ordered ? ? ?Testing/Procedures: ?None Ordered ? ? ?Follow-Up: ?At [facility], you and your health needs are our priority.  As part of our continuing mission to provide you with exceptional heart care, we have created designated Provider Care Teams.  These Care Teams include your primary Cardiologist (physician) and Advanced Practice Providers (APPs -  Physician Assistants and Nurse Practitioners) who all work together to provide you with the care you need, when you need it. ? ?We recommend signing up for the patient portal called "MyChart".  Sign up information is provided on this After Visit Summary.  MyChart is used to connect with patients for Virtual Visits (Telemedicine).  Patients are able to view lab/test results, encounter notes, upcoming appointments, etc.  Non-urgent messages can be sent to your provider as well.   ?To learn more about what you can do with MyChart, go to [email or web address].   ? ?Your next appointment:   ?6 month(s) ? ?The format for your next appointment:   ?In Person ? ?Provider:   ?Dr Rennis Golden  ? ? ? ?

## 2021-11-20 ENCOUNTER — Ambulatory Visit
Admission: RE | Admit: 2021-11-20 | Discharge: 2021-11-20 | Disposition: A | Discharge status: Home / Self Care | Payer: Managed Care, Other (non HMO) | Source: Ambulatory Visit | Attending: Family Medicine | Admitting: Family Medicine

## 2021-11-20 ENCOUNTER — Other Ambulatory Visit: Payer: Self-pay

## 2021-11-20 DIAGNOSIS — Z1231 Encounter for screening mammogram for malignant neoplasm of breast: Secondary | ICD-10-CM

## 2021-11-22 ENCOUNTER — Other Ambulatory Visit: Payer: Self-pay | Admitting: Family Medicine

## 2021-11-22 DIAGNOSIS — R928 Other abnormal and inconclusive findings on diagnostic imaging of breast: Secondary | ICD-10-CM

## 2021-11-25 ENCOUNTER — Other Ambulatory Visit (HOSPITAL_BASED_OUTPATIENT_CLINIC_OR_DEPARTMENT_OTHER): Payer: Self-pay | Admitting: Internal Medicine

## 2021-11-27 ENCOUNTER — Ambulatory Visit (INDEPENDENT_AMBULATORY_CARE_PROVIDER_SITE_OTHER): Payer: Managed Care, Other (non HMO)

## 2021-11-27 DIAGNOSIS — G459 Transient cerebral ischemic attack, unspecified: Secondary | ICD-10-CM

## 2021-11-28 LAB — CUP PACEART REMOTE DEVICE CHECK
Date Time Interrogation Session: 20230326230530
Implantable Pulse Generator Implant Date: 20220303

## 2021-12-07 ENCOUNTER — Ambulatory Visit
Admission: RE | Admit: 2021-12-07 | Discharge: 2021-12-07 | Disposition: A | Discharge status: Home / Self Care | Payer: Managed Care, Other (non HMO) | Source: Ambulatory Visit | Attending: Family Medicine | Admitting: Family Medicine

## 2021-12-07 ENCOUNTER — Ambulatory Visit: Payer: Managed Care, Other (non HMO)

## 2021-12-07 DIAGNOSIS — R928 Other abnormal and inconclusive findings on diagnostic imaging of breast: Secondary | ICD-10-CM

## 2021-12-08 NOTE — Progress Notes (Signed)
Carelink Summary Report / Loop Recorder 

## 2021-12-13 ENCOUNTER — Encounter: Payer: Self-pay | Admitting: *Deleted

## 2021-12-13 DIAGNOSIS — Z006 Encounter for examination for normal comparison and control in clinical research program: Secondary | ICD-10-CM

## 2021-12-13 NOTE — Research (Signed)
Spoke with Anna Combs about [employer]. She states her A1c was 10.0 and Dr Rennis Golden started her on a new medication. Told her I will call back at a later date to see if she would like to come in to be re screened for core. (Have to wait 4 weeks with new meds)  Voices understanding.

## 2021-12-14 ENCOUNTER — Telehealth: Payer: Self-pay | Admitting: Internal Medicine

## 2021-12-14 NOTE — Telephone Encounter (Signed)
Pt c/o medication issue: ? ?1. Name of Medication: icosapent Ethyl (VASCEPA) 1 g capsule ? ?2. How are you currently taking this medication (dosage and times per day)? 2 capsules twice a day ? ?3. Are you having a reaction (difficulty breathing--STAT)? no ? ?4. What is your medication issue? [facility] calling to inform the patient's medication requires a prior auth. ?

## 2021-12-14 NOTE — Telephone Encounter (Signed)
[facility] [phone]) needs preauthorization for Vascepa. ?Left message on patient's phone regarding preauthoriztion for Vascepa and that I have sample for her. Spoke with spouse. He said he would notify patient and for [place] to set aside samples.Samples (5 boxes of 1 gram capsules) placed at front office. ?

## 2021-12-18 NOTE — Telephone Encounter (Signed)
Vascepa PA submitted via CMM ?(Key: [id number]) ?CaseId:[id number];Status:Approved;Review Type:Prior Auth;Coverage Start Date:11/18/2021;Coverage End Date:12/18/2022 ? ? ? ?

## 2021-12-18 NOTE — Telephone Encounter (Signed)
Patient called w/update that med is approved ?

## 2022-01-01 ENCOUNTER — Ambulatory Visit (INDEPENDENT_AMBULATORY_CARE_PROVIDER_SITE_OTHER): Payer: Managed Care, Other (non HMO)

## 2022-01-01 DIAGNOSIS — G459 Transient cerebral ischemic attack, unspecified: Secondary | ICD-10-CM | POA: Diagnosis not present

## 2022-01-01 LAB — CUP PACEART REMOTE DEVICE CHECK
Date Time Interrogation Session: 20230428230503
Implantable Pulse Generator Implant Date: 20220303

## 2022-01-05 ENCOUNTER — Encounter: Payer: Self-pay | Admitting: *Deleted

## 2022-01-05 DIAGNOSIS — Z006 Encounter for examination for normal comparison and control in clinical research program: Secondary | ICD-10-CM

## 2022-01-05 NOTE — Research (Signed)
Message left for MS. [employer] to call be back about Core research.  ?

## 2022-01-15 NOTE — Progress Notes (Signed)
Carelink Summary Report / Loop Recorder 

## 2022-01-24 ENCOUNTER — Encounter: Payer: Self-pay | Admitting: *Deleted

## 2022-01-24 DIAGNOSIS — Z006 Encounter for examination for normal comparison and control in clinical research program: Secondary | ICD-10-CM

## 2022-01-24 NOTE — Research (Signed)
Spoke with Anna Combs about [employer]. She states she would like to come in to screen. Emailed her consent to review.

## 2022-02-05 ENCOUNTER — Encounter: Payer: Self-pay | Admitting: *Deleted

## 2022-02-05 ENCOUNTER — Ambulatory Visit (INDEPENDENT_AMBULATORY_CARE_PROVIDER_SITE_OTHER): Payer: Managed Care, Other (non HMO)

## 2022-02-05 DIAGNOSIS — Z006 Encounter for examination for normal comparison and control in clinical research program: Secondary | ICD-10-CM

## 2022-02-05 DIAGNOSIS — G459 Transient cerebral ischemic attack, unspecified: Secondary | ICD-10-CM

## 2022-02-05 LAB — CUP PACEART REMOTE DEVICE CHECK
Date Time Interrogation Session: 20230531230218
Implantable Pulse Generator Implant Date: 20220303

## 2022-02-05 NOTE — Research (Signed)
Message left for Ms Riederer to remind her of her appointment tomorrow in research at 0730. Encouraged her to call with any questions.

## 2022-02-06 ENCOUNTER — Encounter: Payer: Managed Care, Other (non HMO) | Admitting: *Deleted

## 2022-02-06 ENCOUNTER — Other Ambulatory Visit: Payer: Self-pay

## 2022-02-06 VITALS — BP 104/54 | HR 79 | Temp 98.1°F | Resp 18 | Ht 64.0 in | Wt 162.2 lb

## 2022-02-06 DIAGNOSIS — Z006 Encounter for examination for normal comparison and control in clinical research program: Secondary | ICD-10-CM

## 2022-02-06 NOTE — Research (Cosign Needed Addendum)
Anna Combs Screening run in for [employer] Abnormal Lab report 06-February-2022  Chemistry: Sodium     133   mmol/L               '[]'$ Clinically Significant  $RemoveBefo'[x]'sqBSGrUhpek$ Not Clinically Significant  Glucose  266   mg/dL                   '[]'$ Clinically Significant  $RemoveBefo'[x]'YVGTzpUIvHV$ Not Clinically Significant AST/SGOT  82  U/L                      '[]'$ Clinically Significant  $RemoveBefo'[x]'yoIyOMhXWtf$ Not Clinically Significant Hs-C Reactive Protein   3.9 mg/L  $Rem'[]'MamY$ Clinically Significant  $RemoveBefo'[x]'MntkdMZZjLB$ Not Clinically Significant Gamma Glutamyl Transfers (GGT) 97 U/L  $Re'[]'VIi$ Clinically Significant  $RemoveBefo'[x]'FwiZxJRseuN$ Not Clinically Significant  Follicle Stimulating Hormone (FSH) 17.1 mlU/ml    '[]'$ Clinically Significant  $RemoveBefo'[x]'FahDIJlehBc$ Not Clinically Significant  Hemoglobin A1c 9.9 %     '[x]'$ Clinically Significant$Re'[]'KxK$   Not Clinically Significant    Urinalysis:  Glucose >$RemoveB'1000mg'yezbeaAf$ /dL   '[x]'$ Clinically Significant  $RemoveBefo'[]'nNxsdJPUbfz$ Not Clinically Significant  Specific Gravity 1.043     '[]'$ Clinically Significant  $RemoveBefo'[x]'SNmgJOYAorZ$ Not Clinically Significant   Lipids:  Total Cholesterol  295 mg/dL                '[]'$ Clinically Significant  $RemoveBefo'[x]'nsJyYeampdq$ Not Clinically Significant  Triglyceride     2658   mg/dL                 '[x]'$ Clinically Significant  $RemoveBefo'[]'ZitZRKOrcoP$ Not Clinically Significant HDL-Cholesterol 19 mg/dL                   '[]'$ Clinically Significant  $RemoveBefo'[x]'EagJzvWudqw$ Not Clinically Significant  Apolipoprotein Al 112 mg/dL                  '[]'$ Clinically Significant  $RemoveBefo'[x]'HmxynLPqblu$ Not Clinically Significant  Apolipoprotein B 124.00 mg/dL              '[]'$ Clinically Significant  $RemoveBefo'[x]'yuHYrrzXrUE$ Not Clinically Significant  Apolipoprotein Clll >88.00                      '[]'$ Clinically Significant  $RemoveBefo'[x]'PMJWbpIudHv$ Not Clinically Significant  Non-HDL Cholesterol (calc) 276 mg/dL $RemoveB'[]'seuksUQL$ Clinically Significant  $RemoveBefo'[x]'MuaTySLlwIe$ Not Clinically Significant     Any further action needed to be taken per the PI?  No  A1C is high - suspect this may be disqualifying, despite her high trigs.  Pixie Casino, MD, [facility], [place] Director of the Advanced Lipid Disorders  &  Cardiovascular Risk Reduction Clinic Diplomate of the American Board of Clinical Lipidology Attending Cardiologist  Direct Dial: [phone]  Fax: [phone]  Website:  [email or web address]    Essence Consent     Subject Name: Anna Combs   Subject met inclusion and exclusion criteria.  The informed consent form, study requirements and expectations were reviewed with the subject and questions and concerns were addressed prior to the signing of the consent form.  The subject verbalized understanding of the trial requirements.  The subject agreed to participate in the Essence  trial and signed the informed consent at [place] on 06-February-2022.  The informed consent was obtained prior to performance of any protocol-specific procedures for the subject.  A copy of the signed informed consent was given to the subject and a copy was placed in the subject's medical record.   Anna Combs  Protocol number 1 Consent version  2 Essence  [phone]  Site [address]  SUBJECT ID:   [id number]                        DATE:   06-February-2022       []  FEMALE                            [x]  FEMALE AGE: ETHINICITY:   []  HISPANIC/LATINO      []  NON- HISPANIC/LATINO RACE:           [x]   WHITE             []  BLACK/AFRICAN AMERICAN                         []   ASIAN              []  AMERICAN INDIAN/[place] NATIVE                         []   NATIVE [place]/OTHER PACIFIC ISLANDER                         []   OTHER  FUTURE RESEARCH [x]  USE OF SAMPLES FOR FUTURE RESEARCH [x]  Consented for Sub study CTA INCLUSION CRITERIA Consent      [x]    Pregnancy authorization []   AGE 33 or greater [x]   Triglycerides fasting 150 or greater with either: [x]   Dx of ASCVD (CAD, CVA, PAD) OR []   Increased risk for ASCVD as below []   Type 2 DM OR 2 or more below [x]   Men 21 or greater Woman 71 or greater []  []    Woman with Hx of preeclampsia or premature menopause (before 68) []    Family Hx of premature ASCDD (Before 41 for  males, or before 64 for females  []    Current Tobacco use  []    Metabolic syndrome []    Hypertension with Treatment  []    CKD stage 3 Or (GFR 30-59)  []    LDL-C 160 or greater LDL-C 100 or greater on therapy to lower  []    Elevated high-sensitivity C Reactive protein (>2.0)  []   Elevated lipoprotein (a) (>50mg /dL or 124nmol/L) OR  []   Triglycerides fasting 500 or greater  [x]   Lipid-lowering med (for at least 4 weeks) [place] to comply with diet and lifestyle recommendations  [x]   Females must be non-pregnant and non-lactating and EITHER  []   Surgically sterile, post-menopausal, abstinent OR Use highly effective contraceptive at time of consent until at least 30 weeks after last dose of study drug  [x]   Males must be surgical sterile, abstinent or using a highly effective contraceptive at time of consent until at least 30 weeks after the last dose of study drug   []     EXCLUSION CRITERIA           N/A                                            [x]  Major surgery, peripheral revascularization, or non-urgent PCI within 3 months prior to screening, or planned major surgery or major procedure during the study []   Active pancreatitis within 4 weeks prior to screening []   Acute coronary syndrome or CVA/TIA within 3 months of screening []   Screening  labs: ALT or AST >3.0 x ULN Total bilirubin >1.5 ULN unless due to Gilbert's syndrome GFR >30 Urine Protein/creatine ratio >500 Uncontrolled HTN (BP>180/100 despite Treatment Uncontrolled hypothyroidism TSH>1.5 and T4 < LLN, or Hormone therapy not stable for 4 weeks or greater  $Remove'[]'XIRrWHY$'[]'$'[]'$'[]'$'[]'$'[]'$   DM newly dx within 12 weeks of screening A1c > 9.5 at screening  $RemoveBe'[]'WDtIqbMOw$'[]'$   Change in basal insulin >20% within 3 months prior to screening  $RemoveBe'[]'kIFceHXFZ$   Type 1 Dm: episode of DKA or > 3 episodes of severe hypo glycerides with on 6 months prior to screening   Active infections, HIV, Hep C, Hep B $Rem'[]'Jdnw$   Active infection requiring systemic  antiviral or antimicrobial tx that will not be complete prior to study day 1 or active Covid 19 infection not resolved by study day 1 $Rem'[]'OYED$   Malignancy within 5 years (except for non-melanoma skin ca, cervical in situ ca, breast ductal ca in situ or stage 1 prostate Ca that has been tx $Remov'[]'MgYjqY$   Hypersensitivity to the active substance (olezarsen or placebo) $RemoveBef'[]'cznhBsFTjL$   Tx with another investigational drug or devise within 1 month or screening  $RemoveBe'[]'mRtNEkLMK$   Previous tx with an oligonucleotide within 4 months of screening $RemoveBefo'[]'cUYgzovUsQO$   Con meds/ procedure restrictions:   Systemic corticosteroids of anabolic steroids within 6 weeks prior to screening and during the study unless approved   '[]'$   Use of bile acids resins (colestipol or Colesevelam) within 4 weeks prior to screening or planned during the study  $Remo'[]'GqsKA$   Plasma apheresis within 4 weeks prior to screening or planned during the study  $Remo'[]'QPRHK$   Change in meds known to exacerbate hypertriglyceridemia (beta blockers, thiazides, isotretinoin, oral antidiabetic meds, tamoxifen, estrogens or progestins within 4 weeks prior to screening  $RemoveBe'[]'KvRTagTHP$    Change or expected need for significant change in titration of therapies known to significantly reduce TG (GLP-1 agonists, other incretin mimetics, Phentermine/topiramate, naltrexone/bupropion, Xenical, or bariatric surgery within 3 months prior to screening  $RemoveBe'[]'OkNOBBgez$    Change in antipsychotic meds within 30 days of screening or >441ml within 60 days of Screening  $RemoveBe'[]'lRDJUHHLM$    Blood or plasma donation of 50-433ml within 30 days of screening or>499 within 60 days of screening $RemoveBefo'[]'pOuzBBRszoF$   Unwilling to comply with procedures, following up, or unwillingness to cooperate fully with the investigator $RemoveBeforeDE'[]'BPvRBUpseVZiUxb$   ETOH abuse or recent (<1 year) or other substance abuse $RemoveBeforeD'[]'XAlgjKZOSWaBLq$    Anna Combs here for Run in visit for Essence research study. She reports no abd pain, or other pain. No visits to the ED, urgent care, or her PCP. Hx of hysterectomy noted. She was given time to read over the  consent and ask questions prior to signing the consent. Consent signed at [phone], VS taken at 0748, Blood work at 0753,  EKG 0756,  and Urine at 0803. Dr Lia Foyer in to do exam and talk with Anna Combs.  BP 104/54  HR 79  Temp 98.1  o2sat 97%  Resp 18.  Wt 162.2  Ht 5 ft 4 in   Medications reviewed states that she no longer takes Trulicity,  Prometrium, or Cymbalta. States she takes metformin 500 mg daily  Reports she takes albuterol inhaler as needed for wheezing or shortness of breath started 12-14-13 Xanax for anxiety started 11-10-21 Astelin spray for allergies started 01-24-21 Vit D -supplement started 09-23-20 Plavix for blood thinner started 07-28-20 Benadryl for allergies started 12-14-13 Jardiance for type 2 Dm started 05-17-21 Epi pen as needed for anaphylaxis started 05-17-21 Pepcid for  reflux started 07-26-20 Tricor for High cholesterol started 07-26-20 Proscar for hair loss started 10-31-21 Diflucan as needed for yeast infections started 10-31-20 Neurontin for neuropathy started 04-21-20 Ibuprofen as needed for pain started 07-26-14 Vascepa for high cholesterol started 02-09-19 Metformin for type 2 Dm started 04-30-16 Robaxin as needed for muscle spasms started 01-22-21 Singular for allergies started 09-15-13 Zofran as needed for nausea and vomiting started 01-24-21 Crestor for high cholesterol started 04-30-16 Rybelsus for Type 2 Dm started 11-10-21 Vit C -supplement started 01-24-21 Vit D -supplement started 04-30-06 Zinc gluconate -supplement started 07-26-20 Biotin for hair loss started 2023    Current Outpatient Medications:    albuterol (VENTOLIN HFA) 108 (90 Base) MCG/ACT inhaler, Inhale 2 puffs into the lungs every 6 (six) hours as needed for wheezing or shortness of breath., Disp: , Rfl:    ALPRAZolam (XANAX) 0.25 MG tablet, Take 0.25 mg by mouth as needed., Disp: , Rfl:    azelastine (ASTELIN) 0.1 % nasal spray, Place 2 sprays into both nostrils 2 (two) times daily., Disp: ,  Rfl:    Cholecalciferol (VITAMIN D3) 125 MCG (5000 UT) CAPS, Take 5,000 Units by mouth daily., Disp: , Rfl:    clopidogrel (PLAVIX) 75 MG tablet, Take 75 mg by mouth daily., Disp: , Rfl:    diphenhydrAMINE (BENADRYL) 25 mg capsule, Take 50 mg by mouth every 8 (eight) hours as needed for allergies., Disp: , Rfl:    empagliflozin (JARDIANCE) 10 MG TABS tablet, Take 1 tablet (10 mg total) by mouth daily before breakfast., Disp: 30 tablet, Rfl: 6   EPINEPHrine 0.3 MG/0.3ML SOSY, Inject 0.3 mg as directed as needed for anaphylaxis., Disp: , Rfl:    famotidine (PEPCID) 40 MG tablet, Take 40 mg by mouth 2 (two) times daily., Disp: , Rfl:    fenofibrate (TRICOR) 145 MG tablet, Take 145 mg by mouth daily., Disp: , Rfl:    finasteride (PROSCAR) 5 MG tablet, Take 2.5 mg by mouth daily., Disp: , Rfl:    fluconazole (DIFLUCAN) 150 MG tablet, Take 150 mg by mouth daily as needed for itching (yeast). For yeast infection, Disp: , Rfl:    gabapentin (NEURONTIN) 100 MG capsule, Take 100 mg by mouth 3 (three) times daily., Disp: , Rfl:    ibuprofen (ADVIL,MOTRIN) 800 MG tablet, Take 800 mg by mouth every 8 (eight) hours as needed for migraine (migraines)., Disp: , Rfl: 0   icosapent Ethyl (VASCEPA) 1 g capsule, Take 2 capsules (2 g total) by mouth 2 (two) times daily., Disp: 360 capsule, Rfl: 3   methocarbamol (ROBAXIN) 500 MG tablet, Take 500-1,000 mg by mouth 3 (three) times daily as needed for muscle spasms., Disp: , Rfl:    montelukast (SINGULAIR) 10 MG tablet, Take 10 mg by mouth daily., Disp: , Rfl:    ondansetron (ZOFRAN-ODT) 4 MG disintegrating tablet, Take 4 mg by mouth every 8 (eight) hours as needed for nausea or vomiting., Disp: , Rfl:    rosuvastatin (CRESTOR) 40 MG tablet, Take 40 mg by mouth daily., Disp: , Rfl:    RYBELSUS 14 MG TABS, Take 1 tablet by mouth every morning., Disp: , Rfl:    vitamin C (ASCORBIC ACID) 250 MG tablet, Take 250 mg by mouth 2 (two) times daily., Disp: , Rfl:    Vitamin D,  Ergocalciferol, (DRISDOL) 50000 units CAPS capsule, Take 50,000 Units by mouth 2 (two) times a week. Mon and [place], Disp: , Rfl: 1   zinc gluconate 50 MG tablet, Take 50 mg  by mouth daily., Disp: , Rfl:    Dulaglutide (TRULICITY) 1.5 KD/3.2IZ SOPN, Inject 1.5 mg into the skin every Wednesday. (Patient not taking: Reported on 11/14/2021), Disp: , Rfl:    DULoxetine (CYMBALTA) 60 MG capsule, Take 60 mg by mouth daily. (Patient not taking: Reported on 02/06/2022), Disp: , Rfl:    metFORMIN (GLUCOPHAGE) 500 MG tablet, Take 1,000 mg by mouth 2 (two) times daily. (Patient not taking: Reported on 11/14/2021), Disp: , Rfl: 2   progesterone (PROMETRIUM) 100 MG capsule, , Disp: , Rfl:

## 2022-02-07 NOTE — Progress Notes (Addendum)
Patient seen for enrollment in the ESSENCE trial.  She was screened for CORE but HgbA1C was elevated.  She is now here for screening for ESSENCE.  Has elevated triglycerides followed by both Dr. Tomie China and [facility].  Has prior surgical hysterectomy.  Has a Calcium score of 3.  Recent triglycerides were 724, down from >1000.    Alert, oriented no distress No JVD or carotid bruits. Lungs clear. Cor regular Abd soft.  ECG as recorded.  She understands the CORE and ESSENCE studies, and is here for reconsideration.  Followed by Dr. Rennis Golden.  She agrees to consent and will be rescreened.   Arturo Morton. Riley Kill, MD Medical Director, [facility]

## 2022-02-07 NOTE — Progress Notes (Incomplete Revision)
Patient seen for enrollment in the ESSENCE trial.  She was screened for CORE but HgbA1C was elevated.  She is now here for screening for ESSENCE.  Has elevated triglycerides followed by both Dr. Tomie China and [facility].  Has prior surgical hysterectomy.  Has a Calcium score of 3.  Recent triglycerides were 724, down from >1000.

## 2022-02-08 ENCOUNTER — Encounter: Payer: Self-pay | Admitting: *Deleted

## 2022-02-08 DIAGNOSIS — Z006 Encounter for examination for normal comparison and control in clinical research program: Secondary | ICD-10-CM

## 2022-02-08 NOTE — Research (Signed)
Message left for Anna Combs to inform her that she was a screen fail due to her A1c of 9.9. encouraged her to call back with any questions.

## 2022-02-09 NOTE — Research (Signed)
Anna Combs Screening Run in  06-February-2022

## 2022-02-21 NOTE — Research (Signed)
Anna Combs Essence Screening run in 06-February-2022

## 2022-02-22 NOTE — Research (Addendum)
Keiyonna Sadlowski Screening Run in 06-February-2022 She was screen fail due to A1c   Apolipoprotein B48   16.48 mg/dL              [] Clinically Significant  [x] Not Clinically Significant  Chrystie Nose, MD, [facility], FACP  [place]  [facility]  Medical Director of the Advanced Lipid Disorders &  Cardiovascular Risk Reduction Clinic Diplomate of the American Board of Clinical Lipidology Attending Cardiologist  Direct Dial: [phone]  Fax: [phone]  Website:  [email or web address]    LDL-C is not clinically significant.  Chrystie Nose, MD, [facility], FACP  [place]  [facility]  Medical Director of the Advanced Lipid Disorders &  Cardiovascular Risk Reduction Clinic Diplomate of the American Board of Clinical Lipidology Attending Cardiologist  Direct Dial: [phone]  Fax: [phone]  Website:  [email or web address]

## 2022-02-22 NOTE — Progress Notes (Signed)
Carelink Summary Report / Loop Recorder 

## 2022-02-26 ENCOUNTER — Other Ambulatory Visit: Payer: Self-pay | Admitting: Internal Medicine

## 2022-03-12 ENCOUNTER — Ambulatory Visit (INDEPENDENT_AMBULATORY_CARE_PROVIDER_SITE_OTHER): Payer: 59

## 2022-03-12 DIAGNOSIS — G459 Transient cerebral ischemic attack, unspecified: Secondary | ICD-10-CM

## 2022-03-12 LAB — CUP PACEART REMOTE DEVICE CHECK
Date Time Interrogation Session: 20230703230848
Implantable Pulse Generator Implant Date: 20220303

## 2022-04-09 NOTE — Progress Notes (Signed)
Carelink Summary Report / Loop Recorder 

## 2022-04-12 LAB — CUP PACEART REMOTE DEVICE CHECK
Date Time Interrogation Session: 20230805230755
Implantable Pulse Generator Implant Date: 20220303

## 2022-04-16 ENCOUNTER — Ambulatory Visit: Payer: 59

## 2022-05-15 ENCOUNTER — Encounter: Payer: Self-pay | Admitting: *Deleted

## 2022-05-15 ENCOUNTER — Ambulatory Visit (HOSPITAL_BASED_OUTPATIENT_CLINIC_OR_DEPARTMENT_OTHER): Payer: Managed Care, Other (non HMO) | Admitting: Internal Medicine

## 2022-05-15 DIAGNOSIS — Z006 Encounter for examination for normal comparison and control in clinical research program: Secondary | ICD-10-CM

## 2022-05-15 NOTE — Research (Signed)
Updated ICF and sent copy to patient.

## 2022-05-18 NOTE — Research (Signed)
Anna Combs Core Screening run in 14-Jun-2022

## 2022-05-21 ENCOUNTER — Ambulatory Visit (INDEPENDENT_AMBULATORY_CARE_PROVIDER_SITE_OTHER): Payer: 59

## 2022-05-21 DIAGNOSIS — G459 Transient cerebral ischemic attack, unspecified: Secondary | ICD-10-CM

## 2022-05-22 LAB — CUP PACEART REMOTE DEVICE CHECK
Date Time Interrogation Session: 20230917230356
Implantable Pulse Generator Implant Date: 20220303

## 2022-06-05 NOTE — Progress Notes (Signed)
Carelink Summary Report / Loop Recorder 

## 2022-06-25 ENCOUNTER — Ambulatory Visit (INDEPENDENT_AMBULATORY_CARE_PROVIDER_SITE_OTHER): Payer: 59

## 2022-06-25 DIAGNOSIS — G459 Transient cerebral ischemic attack, unspecified: Secondary | ICD-10-CM

## 2022-06-25 IMAGING — MG MM DIGITAL DIAGNOSTIC UNILAT*L* W/ TOMO W/ CAD
6 series · 6 of 18 positions shown · non-contrast
Comparison: Previous exam(s).

CLINICAL DATA: Recall from screening mammography, possible subtle
architectural distortion in the upper breast at posterior depth
visible only on the MLO view.

EXAM:
DIGITAL DIAGNOSTIC UNILATERAL LEFT MAMMOGRAM WITH TOMOSYNTHESIS AND
CAD
TECHNIQUE: Left digital diagnostic mammography and breast tomosynthesis was
performed. The images were evaluated with computer-aided detection.

[L MLO synth-2D (1 of 2)]
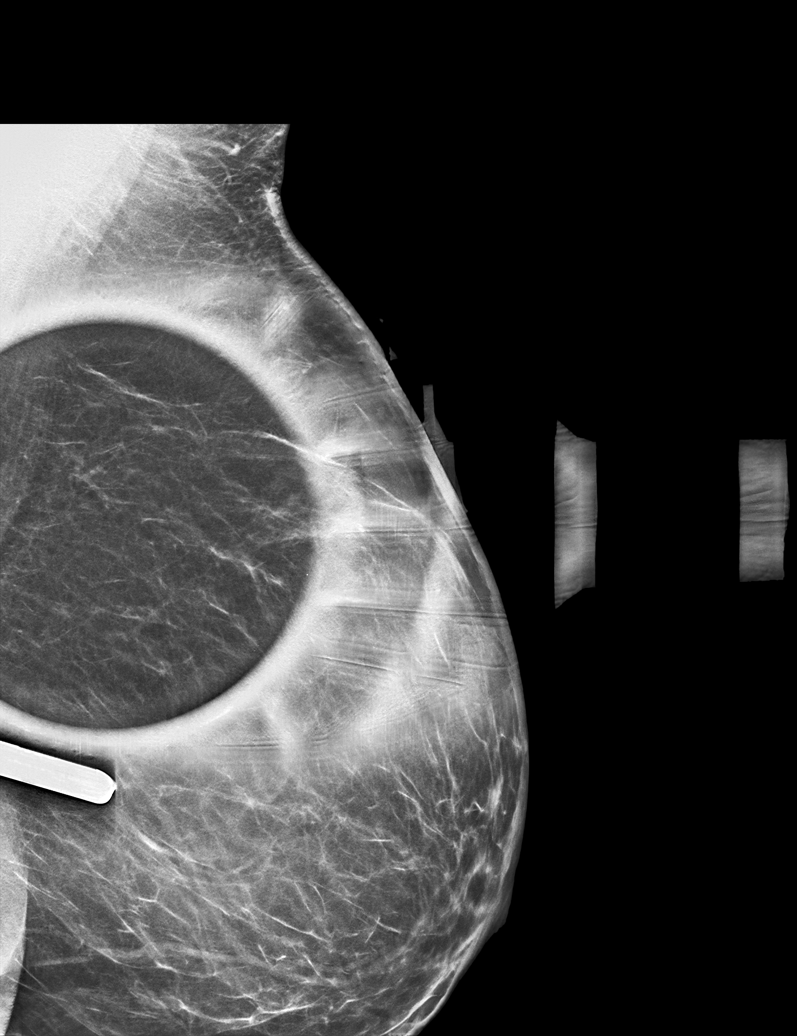

[L ML synth-2D]
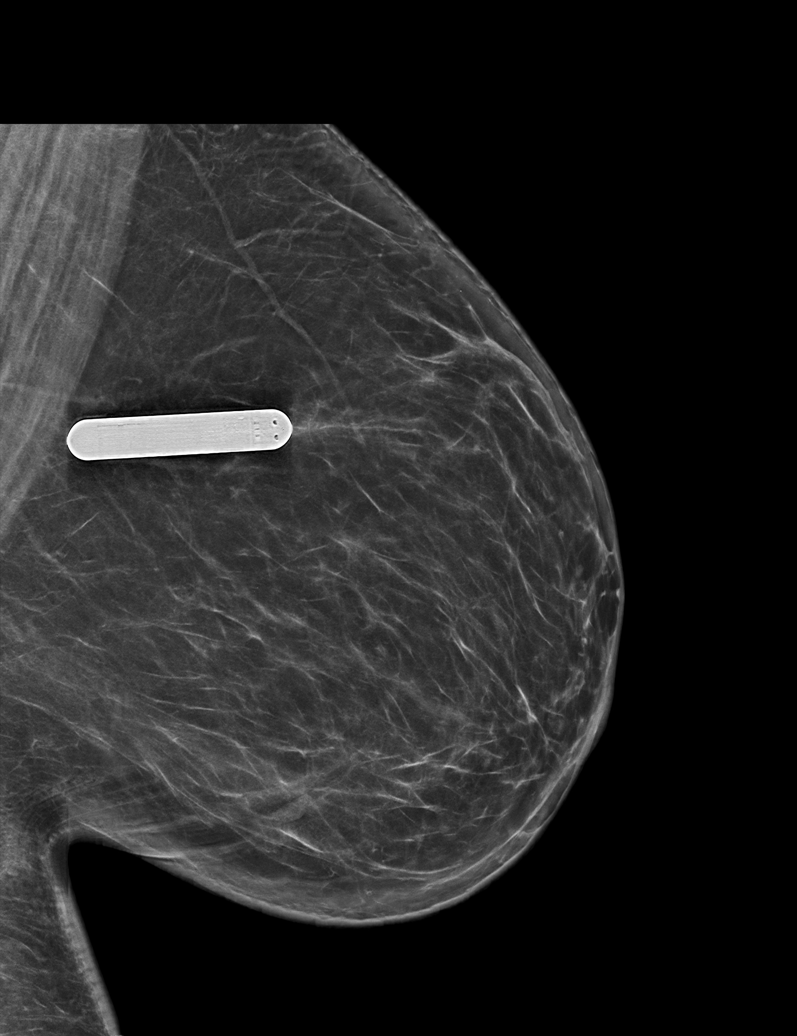

[L MLO synth-2D (2 of 2)]
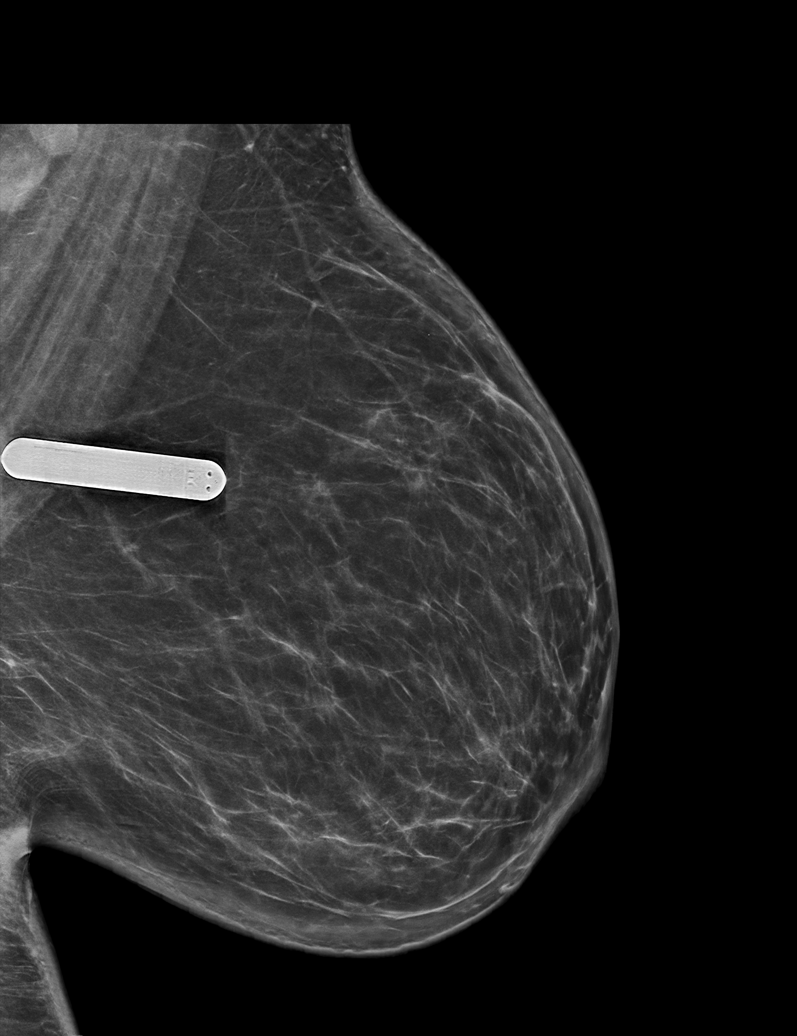

[L ML tomo · tomo slice 37/74.0]
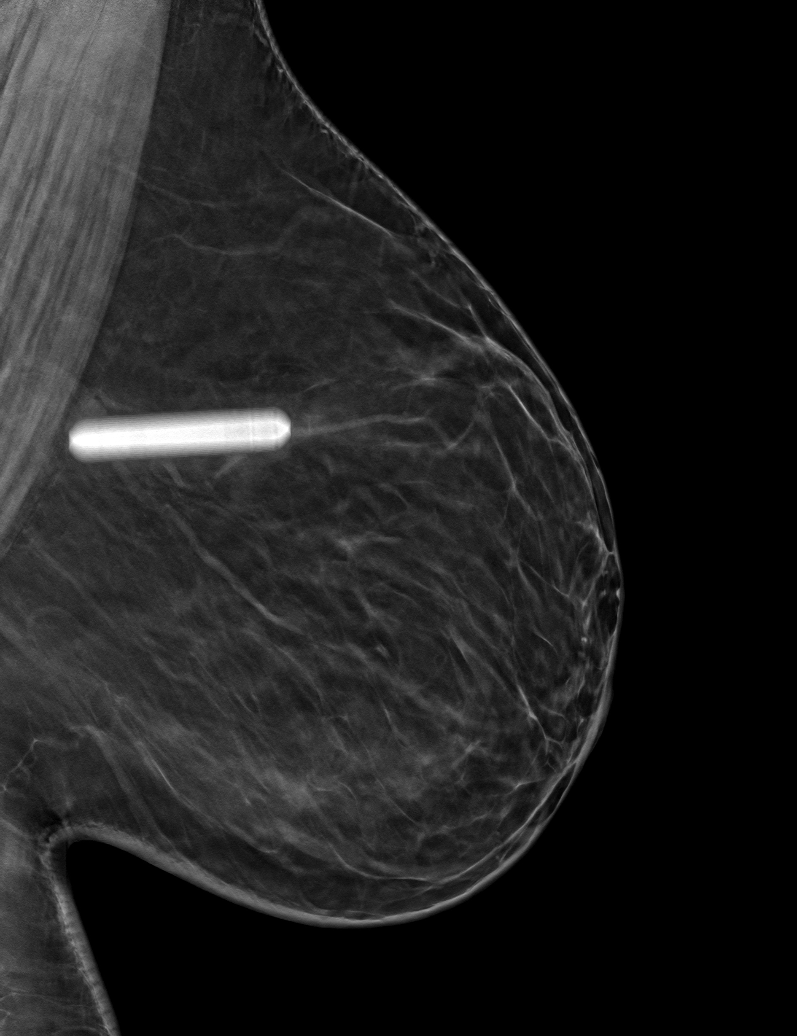

[L MLO tomo (1 of 2) · tomo slice 30/59.0]
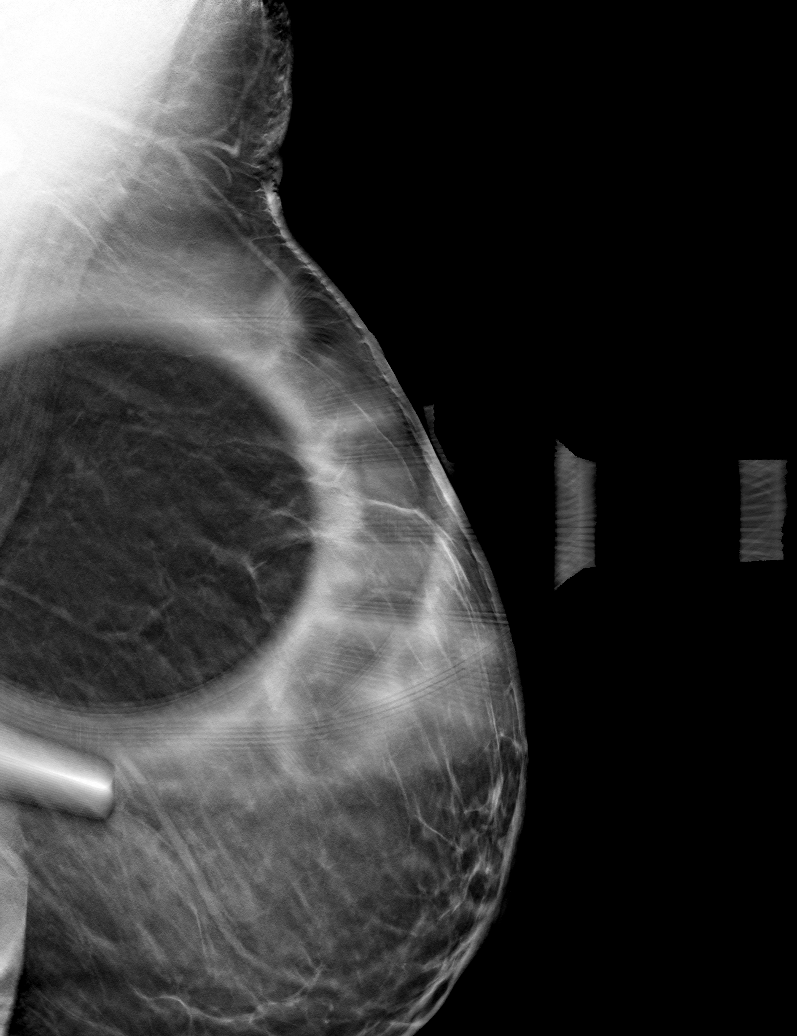

[L MLO tomo (2 of 2) · tomo slice 38/75.0]
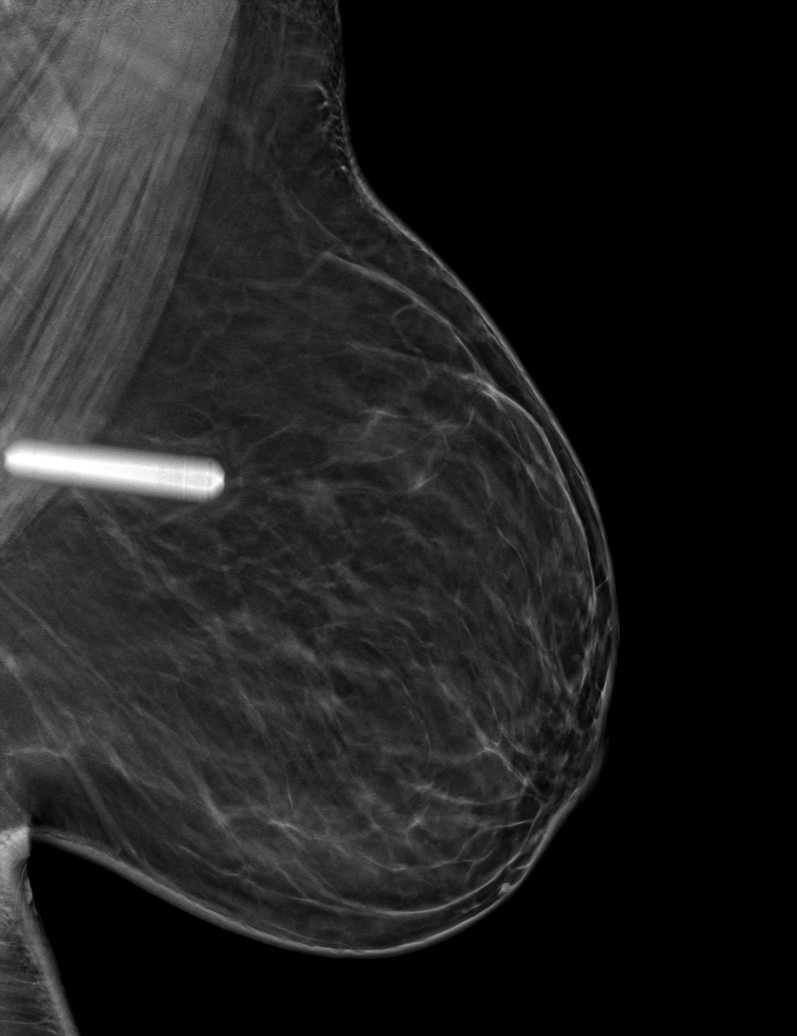

[6 of 18 positions shown; findings below may reference images not displayed]

ACR Breast Density Category b: There are scattered areas of
fibroglandular density.
FINDINGS: Spot-compression MLO view of the area of concern and full field
mediolateral and 60 degree MLO views were obtained.

No persistent architectural distortion in the upper breast at
posterior depth as questioned on screening mammography. The
screening mammographic finding corresponds to overlapping scattered
fibroglandular tissue and Cooper's ligaments.

The patient's indwelling cardiac recorder obscured the area of
concern on the full field mediolateral view. However, the full field
60 degree MLO view confirmed the absence of distortion.

No findings suspicious for malignancy.
IMPRESSION: No mammographic evidence of malignancy involving the LEFT breast.

RECOMMENDATION:
Screening mammogram in one year.(Code:[id number])

I have discussed the findings and recommendations with the patient.
If applicable, a reminder letter will be sent to the patient
regarding the next appointment.

BI-RADS CATEGORY  1: Negative.

## 2022-06-26 LAB — CUP PACEART REMOTE DEVICE CHECK
Date Time Interrogation Session: 20231022230939
Implantable Pulse Generator Implant Date: 20220303

## 2022-07-23 NOTE — Progress Notes (Signed)
Carelink Summary Report / Loop Recorder 

## 2022-07-31 ENCOUNTER — Ambulatory Visit: Payer: Self-pay

## 2022-09-04 ENCOUNTER — Ambulatory Visit (INDEPENDENT_AMBULATORY_CARE_PROVIDER_SITE_OTHER): Payer: Self-pay

## 2022-09-04 DIAGNOSIS — G459 Transient cerebral ischemic attack, unspecified: Secondary | ICD-10-CM

## 2022-09-04 LAB — CUP PACEART REMOTE DEVICE CHECK
Date Time Interrogation Session: 20240101230614
Implantable Pulse Generator Implant Date: 20220303

## 2022-10-05 NOTE — Progress Notes (Signed)
Carelink Summary Report / Loop Recorder 

## 2022-10-07 LAB — CUP PACEART REMOTE DEVICE CHECK
Date Time Interrogation Session: 20240203230450
Implantable Pulse Generator Implant Date: 20220303

## 2022-10-08 ENCOUNTER — Ambulatory Visit: Payer: Self-pay

## 2022-10-08 DIAGNOSIS — G459 Transient cerebral ischemic attack, unspecified: Secondary | ICD-10-CM

## 2022-11-12 ENCOUNTER — Ambulatory Visit (INDEPENDENT_AMBULATORY_CARE_PROVIDER_SITE_OTHER): Payer: Self-pay

## 2022-11-12 DIAGNOSIS — G459 Transient cerebral ischemic attack, unspecified: Secondary | ICD-10-CM

## 2022-11-14 LAB — CUP PACEART REMOTE DEVICE CHECK
Date Time Interrogation Session: 20240310230133
Implantable Pulse Generator Implant Date: 20220303

## 2022-11-22 NOTE — Progress Notes (Signed)
Carelink Summary Report / Loop Recorder 

## 2022-12-17 ENCOUNTER — Ambulatory Visit (INDEPENDENT_AMBULATORY_CARE_PROVIDER_SITE_OTHER): Payer: Self-pay

## 2022-12-17 DIAGNOSIS — G459 Transient cerebral ischemic attack, unspecified: Secondary | ICD-10-CM

## 2022-12-18 LAB — CUP PACEART REMOTE DEVICE CHECK
Date Time Interrogation Session: 20240414230527
Implantable Pulse Generator Implant Date: 20220303

## 2022-12-24 NOTE — Progress Notes (Signed)
Carelink Summary Report / Loop Recorder 

## 2023-01-03 ENCOUNTER — Other Ambulatory Visit (HOSPITAL_COMMUNITY): Payer: Self-pay

## 2023-01-21 ENCOUNTER — Ambulatory Visit (INDEPENDENT_AMBULATORY_CARE_PROVIDER_SITE_OTHER): Payer: Self-pay

## 2023-01-21 DIAGNOSIS — G459 Transient cerebral ischemic attack, unspecified: Secondary | ICD-10-CM

## 2023-01-21 LAB — CUP PACEART REMOTE DEVICE CHECK
Date Time Interrogation Session: 20240517230501
Implantable Pulse Generator Implant Date: 20220303

## 2023-01-22 NOTE — Progress Notes (Signed)
Carelink Summary Report / Loop Recorder 

## 2023-02-19 NOTE — Progress Notes (Signed)
Carelink Summary Report / Loop Recorder 

## 2023-02-21 LAB — CUP PACEART REMOTE DEVICE CHECK
Date Time Interrogation Session: 20240619230436
Implantable Pulse Generator Implant Date: 20220303

## 2023-02-25 ENCOUNTER — Ambulatory Visit (INDEPENDENT_AMBULATORY_CARE_PROVIDER_SITE_OTHER): Payer: Self-pay

## 2023-02-25 DIAGNOSIS — G459 Transient cerebral ischemic attack, unspecified: Secondary | ICD-10-CM

## 2023-03-18 NOTE — Progress Notes (Signed)
 [facility] Summary Report / Loop Recorder 

## 2023-03-28 LAB — CUP PACEART REMOTE DEVICE CHECK
Date Time Interrogation Session: 20240722230054
Implantable Pulse Generator Implant Date: 20220303

## 2023-04-01 ENCOUNTER — Ambulatory Visit (INDEPENDENT_AMBULATORY_CARE_PROVIDER_SITE_OTHER): Payer: Self-pay

## 2023-04-01 DIAGNOSIS — G459 Transient cerebral ischemic attack, unspecified: Secondary | ICD-10-CM

## 2023-04-18 NOTE — Progress Notes (Signed)
Carelink Summary Report / Loop Recorder 

## 2023-04-29 LAB — CUP PACEART REMOTE DEVICE CHECK
Date Time Interrogation Session: 20240824230015
Implantable Pulse Generator Implant Date: 20220303

## 2023-05-07 ENCOUNTER — Ambulatory Visit (INDEPENDENT_AMBULATORY_CARE_PROVIDER_SITE_OTHER): Payer: Self-pay

## 2023-05-07 DIAGNOSIS — G459 Transient cerebral ischemic attack, unspecified: Secondary | ICD-10-CM

## 2023-05-16 NOTE — Progress Notes (Signed)
Carelink Summary Report / Loop Recorder 

## 2023-05-31 ENCOUNTER — Ambulatory Visit: Payer: Self-pay

## 2023-06-01 LAB — CUP PACEART REMOTE DEVICE CHECK
Date Time Interrogation Session: 20240926230152
Implantable Pulse Generator Implant Date: 20220303

## 2023-06-10 ENCOUNTER — Ambulatory Visit: Payer: Self-pay

## 2023-06-10 ENCOUNTER — Ambulatory Visit (INDEPENDENT_AMBULATORY_CARE_PROVIDER_SITE_OTHER): Payer: Self-pay

## 2023-06-10 DIAGNOSIS — G459 Transient cerebral ischemic attack, unspecified: Secondary | ICD-10-CM

## 2023-06-25 NOTE — Progress Notes (Signed)
Carelink Summary Report / Loop Recorder 

## 2023-07-03 ENCOUNTER — Ambulatory Visit: Payer: Self-pay

## 2023-07-07 LAB — CUP PACEART REMOTE DEVICE CHECK
Date Time Interrogation Session: 20241029230405
Implantable Pulse Generator Implant Date: 20220303

## 2023-07-15 ENCOUNTER — Ambulatory Visit: Payer: Self-pay

## 2023-07-15 ENCOUNTER — Ambulatory Visit (INDEPENDENT_AMBULATORY_CARE_PROVIDER_SITE_OTHER): Payer: Self-pay

## 2023-07-15 DIAGNOSIS — G459 Transient cerebral ischemic attack, unspecified: Secondary | ICD-10-CM

## 2023-08-05 ENCOUNTER — Ambulatory Visit (INDEPENDENT_AMBULATORY_CARE_PROVIDER_SITE_OTHER): Payer: Self-pay

## 2023-08-05 ENCOUNTER — Ambulatory Visit: Payer: Self-pay

## 2023-08-05 DIAGNOSIS — G459 Transient cerebral ischemic attack, unspecified: Secondary | ICD-10-CM

## 2023-08-08 LAB — CUP PACEART REMOTE DEVICE CHECK
Date Time Interrogation Session: 20241201230444
Implantable Pulse Generator Implant Date: 20220303

## 2023-08-09 NOTE — Progress Notes (Signed)
Carelink Summary Report / Loop Recorder 

## 2023-08-19 ENCOUNTER — Ambulatory Visit: Payer: Self-pay

## 2023-09-09 ENCOUNTER — Ambulatory Visit (INDEPENDENT_AMBULATORY_CARE_PROVIDER_SITE_OTHER): Payer: Self-pay

## 2023-09-09 DIAGNOSIS — G459 Transient cerebral ischemic attack, unspecified: Secondary | ICD-10-CM

## 2023-09-10 LAB — CUP PACEART REMOTE DEVICE CHECK
Date Time Interrogation Session: 20250105230419
Implantable Pulse Generator Implant Date: 20220303

## 2023-10-14 ENCOUNTER — Ambulatory Visit (INDEPENDENT_AMBULATORY_CARE_PROVIDER_SITE_OTHER): Payer: Self-pay

## 2023-10-14 DIAGNOSIS — G459 Transient cerebral ischemic attack, unspecified: Secondary | ICD-10-CM

## 2023-10-15 LAB — CUP PACEART REMOTE DEVICE CHECK
Date Time Interrogation Session: 20250209230912
Implantable Pulse Generator Implant Date: 20220303

## 2023-10-21 NOTE — Addendum Note (Signed)
Addended by: Geralyn Flash D on: 10/21/2023 10:43 AM   Modules accepted: Orders

## 2023-10-21 NOTE — Progress Notes (Signed)
 [facility] Summary Report / Loop Recorder

## 2023-11-18 ENCOUNTER — Ambulatory Visit (INDEPENDENT_AMBULATORY_CARE_PROVIDER_SITE_OTHER): Payer: Self-pay

## 2023-11-18 DIAGNOSIS — G459 Transient cerebral ischemic attack, unspecified: Secondary | ICD-10-CM

## 2023-11-19 LAB — CUP PACEART REMOTE DEVICE CHECK
Date Time Interrogation Session: 20250316230548
Implantable Pulse Generator Implant Date: 20220303

## 2023-11-21 NOTE — Addendum Note (Signed)
 Addended by: Geralyn Flash D on: 11/21/2023 02:08 PM   Modules accepted: Orders

## 2023-11-21 NOTE — Progress Notes (Signed)
 [facility] Summary Report / Loop Recorder

## 2023-12-23 ENCOUNTER — Ambulatory Visit (INDEPENDENT_AMBULATORY_CARE_PROVIDER_SITE_OTHER): Payer: Self-pay

## 2023-12-23 DIAGNOSIS — G459 Transient cerebral ischemic attack, unspecified: Secondary | ICD-10-CM

## 2023-12-24 LAB — CUP PACEART REMOTE DEVICE CHECK
Date Time Interrogation Session: 20250420230438
Implantable Pulse Generator Implant Date: 20220303

## 2024-01-06 NOTE — Progress Notes (Signed)
 [facility] Summary Report / Loop Recorder

## 2024-01-06 NOTE — Addendum Note (Signed)
 Addended by: Edra Govern D on: 01/06/2024 12:36 PM   Modules accepted: Orders

## 2024-01-28 ENCOUNTER — Ambulatory Visit: Payer: Self-pay

## 2024-01-28 DIAGNOSIS — G459 Transient cerebral ischemic attack, unspecified: Secondary | ICD-10-CM

## 2024-01-28 LAB — CUP PACEART REMOTE DEVICE CHECK
Date Time Interrogation Session: 20250526230951
Implantable Pulse Generator Implant Date: 20220303

## 2024-01-29 ENCOUNTER — Ambulatory Visit: Payer: Self-pay | Admitting: Cardiology

## 2024-02-11 NOTE — Addendum Note (Signed)
 Addended by: Edra Govern D on: 02/11/2024 04:40 PM   Modules accepted: Orders

## 2024-02-11 NOTE — Progress Notes (Signed)
 [facility] Summary Report / Loop Recorder

## 2024-02-27 ENCOUNTER — Ambulatory Visit: Payer: Self-pay | Admitting: Cardiology

## 2024-02-27 ENCOUNTER — Ambulatory Visit: Payer: Self-pay

## 2024-02-27 DIAGNOSIS — G459 Transient cerebral ischemic attack, unspecified: Secondary | ICD-10-CM

## 2024-02-27 LAB — CUP PACEART REMOTE DEVICE CHECK
Date Time Interrogation Session: 20250625230829
Implantable Pulse Generator Implant Date: 20220303

## 2024-03-02 ENCOUNTER — Ambulatory Visit: Payer: Self-pay

## 2024-03-19 NOTE — Progress Notes (Signed)
 [facility] Summary Report / Loop Recorder

## 2024-03-30 ENCOUNTER — Ambulatory Visit (INDEPENDENT_AMBULATORY_CARE_PROVIDER_SITE_OTHER): Payer: Self-pay

## 2024-03-30 DIAGNOSIS — G459 Transient cerebral ischemic attack, unspecified: Secondary | ICD-10-CM

## 2024-03-30 LAB — CUP PACEART REMOTE DEVICE CHECK
Date Time Interrogation Session: 20250727231028
Implantable Pulse Generator Implant Date: 20220303

## 2024-04-02 ENCOUNTER — Ambulatory Visit: Payer: Self-pay | Admitting: Cardiology

## 2024-04-06 ENCOUNTER — Ambulatory Visit: Payer: Self-pay

## 2024-04-30 ENCOUNTER — Ambulatory Visit: Payer: Self-pay

## 2024-04-30 DIAGNOSIS — G459 Transient cerebral ischemic attack, unspecified: Secondary | ICD-10-CM

## 2024-05-01 LAB — CUP PACEART REMOTE DEVICE CHECK
Date Time Interrogation Session: 20250827231106
Implantable Pulse Generator Implant Date: 20220303

## 2024-05-02 ENCOUNTER — Ambulatory Visit: Payer: Self-pay | Admitting: Cardiology

## 2024-05-08 NOTE — Progress Notes (Signed)
 Remote Loop Recorder Transmission

## 2024-05-11 ENCOUNTER — Ambulatory Visit: Payer: Self-pay

## 2024-05-15 NOTE — Progress Notes (Signed)
 [facility] Summary Report / Loop Recorder

## 2024-06-01 ENCOUNTER — Ambulatory Visit: Payer: Self-pay

## 2024-06-01 DIAGNOSIS — G459 Transient cerebral ischemic attack, unspecified: Secondary | ICD-10-CM

## 2024-06-01 LAB — CUP PACEART REMOTE DEVICE CHECK
Date Time Interrogation Session: 20250928231509
Implantable Pulse Generator Implant Date: 20220303

## 2024-06-03 NOTE — Progress Notes (Signed)
 Remote Loop Recorder Transmission

## 2024-06-04 NOTE — Progress Notes (Signed)
 Remote Loop Recorder Transmission

## 2024-06-05 ENCOUNTER — Ambulatory Visit: Payer: Self-pay | Admitting: Cardiology

## 2024-06-15 ENCOUNTER — Ambulatory Visit: Payer: Self-pay

## 2024-06-25 ENCOUNTER — Other Ambulatory Visit: Payer: Self-pay

## 2024-06-25 ENCOUNTER — Encounter: Payer: Self-pay | Admitting: Internal Medicine

## 2024-06-25 ENCOUNTER — Ambulatory Visit (INDEPENDENT_AMBULATORY_CARE_PROVIDER_SITE_OTHER): Payer: Self-pay | Admitting: Internal Medicine

## 2024-06-25 VITALS — BP 106/60 | HR 82 | Temp 97.7°F | Resp 18 | Ht 62.0 in | Wt 160.4 lb

## 2024-06-25 DIAGNOSIS — T63441D Toxic effect of venom of bees, accidental (unintentional), subsequent encounter: Secondary | ICD-10-CM

## 2024-06-25 DIAGNOSIS — T63441A Toxic effect of venom of bees, accidental (unintentional), initial encounter: Secondary | ICD-10-CM | POA: Insufficient documentation

## 2024-06-25 DIAGNOSIS — J310 Chronic rhinitis: Secondary | ICD-10-CM

## 2024-06-25 DIAGNOSIS — J4520 Mild intermittent asthma, uncomplicated: Secondary | ICD-10-CM | POA: Diagnosis not present

## 2024-06-25 DIAGNOSIS — Z880 Allergy status to penicillin: Secondary | ICD-10-CM

## 2024-06-25 DIAGNOSIS — L508 Other urticaria: Secondary | ICD-10-CM

## 2024-06-25 DIAGNOSIS — T7819XA Other adverse food reactions, not elsewhere classified, initial encounter: Secondary | ICD-10-CM | POA: Insufficient documentation

## 2024-06-25 DIAGNOSIS — T7819XD Other adverse food reactions, not elsewhere classified, subsequent encounter: Secondary | ICD-10-CM | POA: Diagnosis not present

## 2024-06-25 DIAGNOSIS — T7800XA Anaphylactic reaction due to unspecified food, initial encounter: Secondary | ICD-10-CM | POA: Insufficient documentation

## 2024-06-25 NOTE — Patient Instructions (Signed)
 Alpha-gal syndrome Diagnosed in 2017 following an anaphylactic episode with projectile vomiting, hives, and swelling. Avoids mammalian meat.  Today tolerates dairy.  Completed acupuncture recently, but no evidence supports its efficacy in curing alpha-gal syndrome. Delayed reactions are common, often occurring at night. - Order alpha-gal blood test to assess current levels. - Ensure up-to-date EpiPen  is available. - continue to avoid mammalian meats.  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 [place]. Bee venom allergies can be fatal and require immediate intervention. - Order bee venom allergy testing. - Consider venom immunotherapy if still allergic. - Recommend wearing an alert bracelet indicating bee allergy.  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 Advised to avoid shellfish due to risk of cross-contamination. No cross-reactivity with fish, and she has tolerated fish without issues. - Advise continued avoidance of shellfish. - Educate on cross-contamination risks in restaurants. - Update allergy testing today. - Continue to carry EpiPen  in case of accidental exposure.  Allergic rhinitis and chronic sinusitis Chronic sinusitis with history of sinus infections requiring multiple rounds of antibiotics due to penicillin allergy. Environmental allergies likely contribute to sinus issues, with symptoms exacerbated in summer and fall. Fluid noted behind left ear indicating possible congestion. - Use daily antihistamine (e.g., Claritin, Allegra, Zyrtec, Xyzal) during peak allergy seasons. - Perform sinus rinses with distilled water to alleviate congestion. - Consider Flonase for nasal inflammation.  2 sprays daily. -Continue montelukast 10 mg daily - Evaluate for environmental allergy testing if sinus infections persist. - Schedule penicillin allergy testing to potentially expand antibiotic  options.  Asthma Asthma developed in adulthood, likely related to smoking history. Symptoms have improved since cessation of smoking but still experience flare-ups triggered by environmental factors like fresh mowed grass. No hospitalizations for asthma. - Continue Singulair (montelukast) for asthma management. - Use albuterol as needed for acute symptoms. - Consider environmental allergy testing if symptoms persist.  Penicillin allergy Reported penicillin allergy with hives as a reaction. Last exposure was many years ago. High likelihood of outgrowing the allergy given the time elapsed. Penicillin class is first-line for many infections, and current alternatives require multiple rounds. - Schedule penicillin allergy testing and amoxicillin challenge.  Follow up : Will call with results once available It was a pleasure meeting you in clinic today! Thank you for allowing me to participate in your care.  Rocky Endow, MD [facility] 

## 2024-06-25 NOTE — Progress Notes (Signed)
 NEW PATIENT Date of Service/Encounter:   06/25/2024 Referring provider: Barbarann Mallie DEL, PA Primary care provider: Zachary Lamar BRAVO, NP  Subjective:  Anna Combs is a 48 y.o. female with a PMHx of DM2, familial hyperlipidemia, diabetes, hypertriglyceridemia, TIA presenting today for evaluation of alpha-gal allergy, penicillin allergy, asthma, allergic rhinitis, venom allergy, shellfish allergy. History obtained from: chart review and patient.   Discussed the use of AI scribe software for clinical note transcription with the patient, who gave verbal consent to proceed.  History of Present Illness Anna Combs is a 48 year old female with alpha-gal syndrome who presents for alpha-gal testing.  Alpha-gal syndrome - Diagnosed in 2017 following an anaphylactic episode characterized by projectile vomiting, urticaria on the back, and significant angioedema involving the mouth and ears, requiring emergency care. - Recently underwent acupuncture with the goal of potentially reintroducing beef or pork into her diet. - Seeking alpha-gal testing to assess current alpha-gal levels. - No issues with dairy consumption.  Asthma and environmental allergies - Asthma onset in adulthood, associated with prior tobacco use. - Symptoms have improved since smoking cessation, but occasional exacerbations occur, triggered by fresh mowed grass and other environmental factors. - Uses albuterol as needed and takes Singulair daily for asthma and allergic symptoms.  Drug allergy: penicillin - Allergy to penicillin resulting in urticaria. - No penicillin exposure in many years due to allergy.  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 - [place], [place] risk of exposure to stinging insects. - carries and up to date epipen .  Shellfish allergy - Shellfish allergy identified at age 63. - Tolerates fish without adverse reactions. - no accidental exposures to shellfish - has up to  date epipen   Seasonal allergic rhinitis and sinus disease - Experiences seasonal allergies, particularly in summer and fall, with grass and ragweed as a significant trigger. - Manages symptoms with Singulair and intermittent use of antihistamines such as Allegra or Zyrtec during severe sinus infections. - History of recurrent sinus disease requiring multiple courses of antibiotics. - Avoids penicillin due to allergy.  Epinephrine  autoinjector access - Possesses an up-to-date EpiPen . - Difficulty obtaining EpiPen  from her usual pharmacy.  Past Medical History: Past Medical History:  Diagnosis Date   Allergic rhinitis    Asthma    At high risk for coronary artery disease 02/09/2019   Diabetes (HCC)    Diabetes mellitus due to underlying condition with unspecified complications (HCC) 07/26/2020   Familial hyperlipidemia 02/09/2019   High cholesterol    Hypertriglyceridemia    Insomnia 11/28/2020   Menopause 11/28/2020   TIA (transient ischemic attack)    Type 2 diabetes mellitus without complication 01/03/2016   Medication List:  Current Outpatient Medications  Medication Sig Dispense Refill   albuterol (VENTOLIN HFA) 108 (90 Base) MCG/ACT inhaler Inhale 2 puffs into the lungs every 6 (six) hours as needed for wheezing or shortness of breath.     ALPRAZolam (XANAX) 0.25 MG tablet Take 0.25 mg by mouth as needed.     azelastine (ASTELIN) 0.1 % nasal spray Place 2 sprays into both nostrils 2 (two) times daily.     Cholecalciferol (VITAMIN D3) 125 MCG (5000 UT) CAPS Take 5,000 Units by mouth daily.     clopidogrel (PLAVIX) 75 MG tablet Take 75 mg by mouth daily.     diphenhydrAMINE (BENADRYL) 25 mg capsule Take 50 mg by mouth every 8 (eight) hours as needed for allergies.     Dulaglutide (TRULICITY) 1.5  MG/0.5ML SOPN Inject 1.5 mg into the skin every Wednesday.     DULoxetine (CYMBALTA) 60 MG capsule Take 60 mg by mouth daily.     empagliflozin  (JARDIANCE ) 10 MG TABS tablet Take 1 tablet (10  mg total) by mouth daily before breakfast. 30 tablet 6   EPINEPHrine  0.3 MG/0.3ML SOSY Inject 0.3 mg as directed as needed for anaphylaxis.     EPIPEN  2-PAK 0.3 MG/0.3ML SOAJ injection as directed Injection Once a day; Duration: 1 days     famotidine (PEPCID) 40 MG tablet Take 40 mg by mouth 2 (two) times daily.     fenofibrate (TRICOR) 145 MG tablet Take 145 mg by mouth daily.     finasteride (PROSCAR) 5 MG tablet Take 2.5 mg by mouth daily.     fluconazole (DIFLUCAN) 150 MG tablet Take 150 mg by mouth daily as needed for itching (yeast). For yeast infection     gabapentin (NEURONTIN) 100 MG capsule Take 100 mg by mouth 3 (three) times daily.     ibuprofen (ADVIL,MOTRIN) 800 MG tablet Take 800 mg by mouth every 8 (eight) hours as needed for migraine (migraines).  0   insulin NPH Human (NOVOLIN N) 100 UNIT/ML injection injct 5 units Subcutaneous every 12 hours (every 3 days, increase by 1 unit q12h if FBG > 150, MAX DAILY DOSE 50 UNITS/DAY); Duration: 30 days     methocarbamol (ROBAXIN) 500 MG tablet Take 500-1,000 mg by mouth 3 (three) times daily as needed for muscle spasms.     montelukast (SINGULAIR) 10 MG tablet Take 10 mg by mouth daily.     ondansetron (ZOFRAN-ODT) 4 MG disintegrating tablet Take 4 mg by mouth every 8 (eight) hours as needed for nausea or vomiting.     rosuvastatin (CRESTOR) 40 MG tablet Take 40 mg by mouth daily.     RYBELSUS 14 MG TABS Take 1 tablet by mouth every morning.     VASCEPA  1 g capsule TAKE 2 CAPSULES BY MOUTH TWICE DAILY 360 capsule 3   vitamin C (ASCORBIC ACID) 250 MG tablet Take 250 mg by mouth 2 (two) times daily.     Vitamin D, Ergocalciferol, (DRISDOL) 50000 units CAPS capsule Take 50,000 Units by mouth 2 (two) times a week. Mon and Thur  1   zinc gluconate 50 MG tablet Take 50 mg by mouth daily.     metFORMIN (GLUCOPHAGE) 500 MG tablet Take 1,000 mg by mouth 2 (two) times daily. (Patient not taking: Reported on 06/25/2024)  2   progesterone (PROMETRIUM)  100 MG capsule  (Patient not taking: Reported on 06/25/2024)     No current facility-administered medications for this visit.   Known Allergies:  Allergies  Allergen Reactions   Alpha-Gal Anaphylaxis   Bee Venom Anaphylaxis   Codeine Shortness Of Breath   Penicillins Hives   Shellfish Allergy Shortness Of Breath   Sulfa Antibiotics Shortness Of Breath   Aspirin Hives   Past Surgical History: Past Surgical History:  Procedure Laterality Date   ABDOMINAL HYSTERECTOMY     BUBBLE STUDY  11/01/2020   Procedure: BUBBLE STUDY;  Surgeon: Mona Vinie BROCKS, MD;  Location: [facility];  Service: Cardiovascular;;   CHOLECYSTECTOMY     LOOP RECORDER INSERTION N/A 11/01/2020   Procedure: LOOP RECORDER INSERTION;  Surgeon: Kelsie Agent, MD;  Location: [facility];  Service: Cardiovascular;  Laterality: N/A;   TEE WITHOUT CARDIOVERSION N/A 11/01/2020   Procedure: TRANSESOPHAGEAL ECHOCARDIOGRAM (TEE);  Surgeon: Mona Vinie BROCKS, MD;  Location:  [facility];  Service: Cardiovascular;  Laterality: N/A;   TUBAL LIGATION     Family History: Family History  Problem Relation Age of Onset   Hypertension Father    Allergic rhinitis Paternal Uncle    Diabetes Maternal Grandmother    Social History: Anna Combs lives in a house built in [place], no water damage, hardwood floors, heat pump heating and cooling, 1 indoor dog, 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  She quit smoking in 2014, smoked for 20 years 2 packs a day.  No HEPA filter in the home.  [place]   ROS:  All other systems negative except as noted per HPI.  Objective:  Blood pressure 106/60, pulse 82, temperature 97.7 F (36.5 C), temperature source Temporal, resp. rate 18, height 5' 2 (1.575 m), weight 160 lb 6.4 oz (72.8 kg), SpO2 97%. Body mass index is 29.34 kg/m. Physical Exam:  General Appearance:  Alert,  cooperative, no distress, appears stated age  Head:  Normocephalic, without obvious abnormality, atraumatic  Eyes:  Conjunctiva clear, EOM's intact  Ears Normal right TM, left TM with fluid present and EACs normal bilaterally  Nose: Nares normal, hypertrophic turbinates, normal mucosa, and no visible anterior polyps  Throat: Lips, tongue normal; teeth and gums normal, normal posterior oropharynx  Neck: Supple, symmetrical  Lungs:   clear to auscultation bilaterally, Respirations unlabored, no coughing  Heart:  regular rate and rhythm and no murmur, Appears well perfused  Extremities: No edema  Skin: Skin color, texture, turgor normal and no rashes or lesions on visualized portions of skin  Neurologic: No gross deficits   Diagnostics: Spirometry:  Tracings reviewed. Her effort: Good reproducible efforts. FVC: 2.85L  FEV1: 2.02L, 77% predicted  FEV1/FVC ratio: 0.71  Interpretation: Nonobstructive ratio, low FEV1.  Please see scanned spirometry results for details.   Labs:  Lab Orders         Alpha-Gal Panel         Hymenoptera Venom Allergy II         Allergen Profile, Shellfish       Assessment and Plan  Assessment and Plan Assessment & Plan Alpha-gal syndrome Diagnosed in 2017 following an anaphylactic episode with projectile vomiting, hives, and swelling. Avoids mammalian meat.  Today tolerates dairy.  Completed acupuncture recently, but no evidence supports its efficacy in curing alpha-gal syndrome. Delayed reactions are common, often occurring at night. - Order alpha-gal blood test to assess current levels. - Ensure up-to-date EpiPen  is available. - continue to avoid mammalian meats.  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 [place] risk [place]. Bee venom allergies can be fatal and require immediate intervention. - Order bee venom allergy testing. - Consider venom immunotherapy if still allergic. -  Recommend wearing an alert bracelet indicating bee allergy.  Shellfish allergy Allergic to shellfish since age 9. Advised to avoid shellfish due to risk of cross-contamination. No cross-reactivity with fish, and she has tolerated fish without issues. - Advise continued avoidance of shellfish. - Educate on cross-contamination risks in restaurants. - Update allergy testing today. - Continue to carry EpiPen  in case of accidental exposure.  Allergic rhinitis and chronic sinusitis Chronic sinusitis with history of sinus infections requiring multiple rounds of antibiotics due to penicillin allergy. Environmental allergies likely contribute to sinus issues, with symptoms exacerbated in summer and  fall. Fluid noted behind left ear indicating possible congestion. - Use daily antihistamine (e.g., Claritin, Allegra, Zyrtec, Xyzal) during peak allergy seasons. - Perform sinus rinses with distilled water to alleviate congestion. - Consider Flonase for nasal inflammation.  2 sprays daily. -Continue montelukast 10 mg daily - Evaluate for environmental allergy testing if sinus infections persist. - Schedule penicillin allergy testing to potentially expand antibiotic options.  Asthma Asthma developed in adulthood, likely related to smoking history. Symptoms have improved since cessation of smoking but still experience flare-ups triggered by environmental factors like fresh mowed grass. No hospitalizations for asthma. - Continue Singulair (montelukast) for asthma management. - Use albuterol as needed for acute symptoms. - Consider environmental allergy testing if symptoms persist.  Penicillin allergy Reported penicillin allergy with hives as a reaction. Last exposure was many years ago. High likelihood of outgrowing the allergy given the time elapsed. Penicillin class is first-line for many infections, and current alternatives require multiple rounds. - Schedule penicillin allergy testing and amoxicillin  challenge.  Follow up : Will call with results once available It was a pleasure meeting you in clinic today! Thank you for allowing me to participate in your care.  Rocky Endow, MD [facility]    This note in its entirety was forwarded to the Provider who requested this consultation.  Other: Emergency action plan provided and reviewed, Neilmed sinus rinse sample provided  Thank you for your kind referral. I appreciate the opportunity to take part in Anna Combs's care. Please do not hesitate to contact me with questions.  Sincerely,  Rocky Endow, MD [facility] 

## 2024-06-28 LAB — HYMENOPTERA VENOM ALLERGY II

## 2024-06-29 ENCOUNTER — Ambulatory Visit: Payer: Self-pay | Admitting: Internal Medicine

## 2024-06-29 NOTE — Progress Notes (Signed)
 Tried calling, unable to leave message due to voicemail box being full. My Chart message sent.

## 2024-06-29 NOTE — Progress Notes (Signed)
 Please let Anna Combs know that her alpha gal remains positive. I would not recommend reintroducing mammalian meat in her diet at this time. I would continue to strictly avoid and carry an epinephrine  device in case of accidental exposure and symptoms.  Her shellfish panel returned negative. Still waiting on her hymenoptera panel (bee venom panel) results to return.  We can offer a shellfish challenge in clinic if she would like to reintroduce shellfish.  Food challenge instructions: You must be off antihistamines for 3-5 days before. Must be in good health and not ill. No vaccines/injections/antibiotics within the past 7 days. Plan on being in the office for 2-4 hours and must bring in the food you want to do the oral challenge for.

## 2024-07-01 LAB — ALPHA-GAL PANEL
Allergen Lamb IgE: 2.42 kU/L — AB
Beef IgE: 2.85 kU/L — AB
IgE (Immunoglobulin E), Serum: 161 [IU]/mL (ref 6–495)
O215-IgE Alpha-Gal: 3.89 kU/L — AB
Pork IgE: 1.72 kU/L — AB

## 2024-07-01 LAB — ALLERGEN PROFILE, SHELLFISH
Clam IgE: <0.1 kU/L
F023-IgE Crab: <0.1 kU/L
F080-IgE Lobster: <0.1 kU/L
F290-IgE Oyster: <0.1 kU/L
Scallop IgE: <0.1 kU/L
Shrimp IgE: <0.1 kU/L

## 2024-07-01 LAB — HYMENOPTERA VENOM ALLERGY II
Bumblebee: 2.8 kU/L — AB
Hornet, White Face, IgE: 1.01 kU/L — AB
Hornet, Yellow, IgE: 0.71 kU/L — AB
I001-IgE Honeybee: 2.66 kU/L — AB
I208-IgE Api m 1: <0.1 kU/L
I209-IgE Ves v 5: 1.79 kU/L — AB
I211-IgE Ves v 1: 0.45 kU/L — AB
I211-IgE Ves v 1: <0.1 kU/L
I214-IgE Api m 2: 0.35 kU/L — AB
I215-IgE Api m 3: <0.1 kU/L
I216-IgE Api m 5: 15.5 kU/L — AB
I217-IgE Api m 10: <0.1 kU/L
Reflex Information: 0.97 kU/L — AB
Reflex Information: 1.47 kU/L — AB
Tryptase: 5.8 ug/L (ref 2.2–13.2)

## 2024-07-01 LAB — ALLERGEN COMPONENT COMMENTS

## 2024-07-01 NOTE — Progress Notes (Signed)
 Can we mail results and result note to her home as well? I want to make sure she knows to continue avoiding mammalian meat.

## 2024-07-02 ENCOUNTER — Ambulatory Visit (INDEPENDENT_AMBULATORY_CARE_PROVIDER_SITE_OTHER): Payer: Self-pay

## 2024-07-02 ENCOUNTER — Ambulatory Visit: Payer: Self-pay | Admitting: Cardiology

## 2024-07-02 DIAGNOSIS — G459 Transient cerebral ischemic attack, unspecified: Secondary | ICD-10-CM

## 2024-07-02 LAB — CUP PACEART REMOTE DEVICE CHECK
Date Time Interrogation Session: 20251029230725
Implantable Pulse Generator Implant Date: 20220303

## 2024-07-07 NOTE — Progress Notes (Signed)
 Remote Loop Recorder Transmission

## 2024-07-20 ENCOUNTER — Ambulatory Visit: Payer: Self-pay

## 2024-08-02 ENCOUNTER — Ambulatory Visit: Payer: Self-pay

## 2024-08-03 ENCOUNTER — Ambulatory Visit: Payer: Self-pay

## 2024-08-04 ENCOUNTER — Ambulatory Visit: Payer: Self-pay

## 2024-08-04 DIAGNOSIS — G459 Transient cerebral ischemic attack, unspecified: Secondary | ICD-10-CM

## 2024-08-05 LAB — CUP PACEART REMOTE DEVICE CHECK
Date Time Interrogation Session: 20251201230314
Implantable Pulse Generator Implant Date: 20220303

## 2024-08-06 ENCOUNTER — Ambulatory Visit: Payer: Self-pay | Admitting: Cardiology

## 2024-08-07 NOTE — Progress Notes (Signed)
 Remote Loop Recorder Transmission

## 2024-08-24 ENCOUNTER — Ambulatory Visit: Payer: Self-pay

## 2024-09-02 ENCOUNTER — Ambulatory Visit: Payer: Self-pay

## 2024-09-04 ENCOUNTER — Ambulatory Visit: Payer: Self-pay

## 2024-09-04 DIAGNOSIS — G459 Transient cerebral ischemic attack, unspecified: Secondary | ICD-10-CM

## 2024-09-04 LAB — CUP PACEART REMOTE DEVICE CHECK
Date Time Interrogation Session: 20260101230636
Implantable Pulse Generator Implant Date: 20220303

## 2024-09-05 ENCOUNTER — Ambulatory Visit: Payer: Self-pay | Admitting: Cardiology

## 2024-09-09 NOTE — Progress Notes (Signed)
 Remote Loop Recorder Transmission

## 2024-10-03 ENCOUNTER — Ambulatory Visit: Payer: Self-pay

## 2024-10-05 ENCOUNTER — Ambulatory Visit: Payer: Self-pay

## 2024-10-06 LAB — CUP PACEART REMOTE DEVICE CHECK
Date Time Interrogation Session: 20260201231343
Implantable Pulse Generator Implant Date: 20220303

## 2024-10-15 ENCOUNTER — Ambulatory Visit: Admitting: Podiatry

## 2024-11-05 ENCOUNTER — Ambulatory Visit: Payer: Self-pay

## 2024-12-06 ENCOUNTER — Ambulatory Visit: Payer: Self-pay

## 2025-01-06 ENCOUNTER — Ambulatory Visit: Payer: Self-pay

## 2025-02-06 ENCOUNTER — Ambulatory Visit: Payer: Self-pay

## 2025-03-09 ENCOUNTER — Ambulatory Visit: Payer: Self-pay

## 2025-04-09 ENCOUNTER — Ambulatory Visit: Payer: Self-pay

## 2025-05-10 ENCOUNTER — Ambulatory Visit: Payer: Self-pay

## 2025-06-10 ENCOUNTER — Ambulatory Visit: Payer: Self-pay

## 2025-07-11 ENCOUNTER — Ambulatory Visit: Payer: Self-pay

## 2025-08-11 ENCOUNTER — Ambulatory Visit: Payer: Self-pay

## 2025-09-11 ENCOUNTER — Ambulatory Visit: Payer: Self-pay
